# Patient Record
Sex: Male | Born: 1980 | Race: White | Hispanic: No | Marital: Single | Smoking: Current every day smoker
Health system: Southern US, Community
[De-identification: ages and names within clinical notes are randomized; demographics above are authoritative.]

## PROBLEM LIST (undated history)

## (undated) DIAGNOSIS — F1320 Sedative, hypnotic or anxiolytic dependence, uncomplicated: Secondary | ICD-10-CM

## (undated) DIAGNOSIS — F419 Anxiety disorder, unspecified: Secondary | ICD-10-CM

## (undated) DIAGNOSIS — F1010 Alcohol abuse, uncomplicated: Secondary | ICD-10-CM

## (undated) HISTORY — PX: ROTATOR CUFF REPAIR: SHX139

---

## 1999-08-27 ENCOUNTER — Encounter: Payer: Self-pay | Admitting: Orthopedic Surgery

## 1999-08-27 ENCOUNTER — Observation Stay (HOSPITAL_COMMUNITY): Admission: RE | Admit: 1999-08-27 | Discharge: 1999-08-28 | Payer: Self-pay | Admitting: Orthopedic Surgery

## 2002-08-18 ENCOUNTER — Ambulatory Visit (HOSPITAL_COMMUNITY): Admission: EM | Admit: 2002-08-18 | Discharge: 2002-08-18 | Payer: Self-pay | Admitting: Emergency Medicine

## 2003-10-14 ENCOUNTER — Emergency Department (HOSPITAL_COMMUNITY): Admission: EM | Admit: 2003-10-14 | Discharge: 2003-10-15 | Payer: Self-pay | Admitting: Emergency Medicine

## 2005-02-01 ENCOUNTER — Emergency Department (HOSPITAL_COMMUNITY): Admission: EM | Admit: 2005-02-01 | Discharge: 2005-02-01 | Payer: Self-pay | Admitting: Emergency Medicine

## 2010-08-02 ENCOUNTER — Encounter: Payer: Self-pay | Admitting: Emergency Medicine

## 2012-05-02 ENCOUNTER — Encounter (HOSPITAL_COMMUNITY): Payer: Self-pay | Admitting: Emergency Medicine

## 2012-05-02 ENCOUNTER — Emergency Department (HOSPITAL_COMMUNITY)
Admission: EM | Admit: 2012-05-02 | Discharge: 2012-05-02 | Disposition: A | Discharge status: Home / Self Care | Payer: Self-pay | Attending: Emergency Medicine | Admitting: Emergency Medicine

## 2012-05-02 ENCOUNTER — Emergency Department (HOSPITAL_COMMUNITY): Payer: Self-pay

## 2012-05-02 DIAGNOSIS — R350 Frequency of micturition: Secondary | ICD-10-CM | POA: Insufficient documentation

## 2012-05-02 DIAGNOSIS — M549 Dorsalgia, unspecified: Secondary | ICD-10-CM | POA: Insufficient documentation

## 2012-05-02 LAB — URINALYSIS, ROUTINE W REFLEX MICROSCOPIC
Bilirubin Urine: NEGATIVE
Glucose, UA: NEGATIVE mg/dL
Hgb urine dipstick: NEGATIVE
Ketones, ur: NEGATIVE mg/dL
Leukocytes, UA: NEGATIVE
Nitrite: NEGATIVE
Protein, ur: NEGATIVE mg/dL
Specific Gravity, Urine: 1.023 (ref 1.005–1.030)
Urobilinogen, UA: 0.2 mg/dL (ref 0.0–1.0)
pH: 6 (ref 5.0–8.0)

## 2012-05-02 LAB — COMPREHENSIVE METABOLIC PANEL
AST: 18 U/L (ref 0–37)
BUN: 19 mg/dL (ref 6–23)
CO2: 27 mEq/L (ref 19–32)
Chloride: 101 mEq/L (ref 96–112)
Creatinine, Ser: 1.19 mg/dL (ref 0.50–1.35)
GFR calc Af Amer: >90 mL/min (ref >90)
GFR calc non Af Amer: 79 mL/min — ABNORMAL LOW (ref >90)
Glucose, Bld: 95 mg/dL (ref 70–99)
Total Bilirubin: 0.3 mg/dL (ref 0.3–1.2)

## 2012-05-02 LAB — CBC WITH DIFFERENTIAL/PLATELET
Eosinophils Relative: 4 % (ref 0–5)
HCT: 43.2 % (ref 39.0–52.0)
Hemoglobin: 15 g/dL (ref 13.0–17.0)
Lymphocytes Relative: 43 % (ref 12–46)
Lymphs Abs: 2.9 10*3/uL (ref 0.7–4.0)
MCV: 86.9 fL (ref 78.0–100.0)
Monocytes Absolute: 0.5 10*3/uL (ref 0.1–1.0)
Monocytes Relative: 8 % (ref 3–12)
Neutro Abs: 3.1 10*3/uL (ref 1.7–7.7)
WBC: 6.9 10*3/uL (ref 4.0–10.5)

## 2012-05-02 NOTE — ED Notes (Signed)
Pt transported to xray 

## 2012-05-02 NOTE — ED Notes (Signed)
Pt complains of frequent urination since Saturday, no blood noted, pain went away for Sunday and Monday however started to complain of back pain this am with frequent urination again.

## 2012-05-02 NOTE — ED Notes (Signed)
Pt states he is having frequent urination and low back pain   Pt states sxs started Saturday night  Pt states two mths ago he woke up one night and used the restroom and had a burning sensation  Pt states he  drank cranberry juice for a couple of days and it went away

## 2012-05-02 NOTE — ED Provider Notes (Signed)
History     CSN: [id number]  Arrival date & time 05/02/12  1478   First MD Initiated Contact with Patient 05/02/12 0404      Chief Complaint  Patient presents with  . Urinary Frequency    (Consider location/radiation/quality/duration/timing/severity/associated sxs/prior treatment) HPI Comments: 2 days ago, patient developed urinary frequency, and a feeling of fullness, mostly at night.  He reports, that 8 days, ago.  He's told her he stopped drinking 12-24 beers a day, but he been doing for years.  Denies any abdominal trauma, penile discharge with the sexual behavior.  Patient is a 31 y.o. male presenting with frequency. The history is provided by the patient.  Urinary Frequency This is a new problem. The current episode started in the past 7 days. The problem occurs intermittently. The problem has been unchanged. Pertinent negatives include no abdominal pain, anorexia, fever, nausea, vomiting or weakness.    History reviewed. No pertinent past medical history.  Past Surgical History  Procedure Date  . Rotator cuff repair     History reviewed. No pertinent family history.  History  Substance Use Topics  . Smoking status: Never Smoker   . Smokeless tobacco: Not on file  . Alcohol Use: Yes     socially      Review of Systems  Constitutional: Negative for fever.  Gastrointestinal: Negative for nausea, vomiting, abdominal pain and anorexia.  Genitourinary: Positive for frequency. Negative for dysuria, urgency, decreased urine volume, discharge, penile swelling, scrotal swelling and testicular pain.  Musculoskeletal: Positive for back pain.  Neurological: Negative for weakness.    Allergies  Review of patient's allergies indicates no known allergies.  Home Medications   Current Outpatient Rx  Name Route Sig Dispense Refill  . CLONAZEPAM 0.5 MG PO TABS Oral Take 0.5 mg by mouth at bedtime as needed. Sleep      BP 144/102  Pulse 75  Temp 98.4 F (36.9 C)  (Oral)  Resp 20  SpO2 97%  Physical Exam  Constitutional: He is oriented to person, place, and time. He appears well-developed and well-nourished.  HENT:  Head: Normocephalic.  Eyes: Pupils are equal, round, and reactive to light.  Neck: Normal range of motion.  Cardiovascular: Normal rate.   Pulmonary/Chest: Effort normal.  Abdominal: Soft. He exhibits no distension. There is no tenderness.  Musculoskeletal: Normal range of motion.  Neurological: He is alert and oriented to person, place, and time.  Skin: Skin is warm. No rash noted. No erythema.    ED Course  Procedures (including critical care time)  Labs Reviewed  COMPREHENSIVE METABOLIC PANEL - Abnormal; Notable for the following:    GFR calc non Af Amer 79 (*)     All other components within normal limits  URINALYSIS, ROUTINE W REFLEX MICROSCOPIC  CBC WITH DIFFERENTIAL   Dg Abd Acute W/chest  05/02/2012  *RADIOLOGY REPORT*  Clinical Data: Urinary frequency, low back pain.  ACUTE ABDOMEN SERIES (ABDOMEN 2 VIEW & CHEST 1 VIEW)  Comparison: None.  Findings: Lungs are clear.  Cardiomediastinal contours are within normal limits.  No free intraperitoneal air. The bowel gas pattern is non- obstructive. Organ outlines are normal where seen. No acute or aggressive osseous abnormality identified. There is a curvilinear calcific density projecting over the right upper quadrant, nonspecific, however not favored to be renal in origin.  Radiograph has limited sensitivity for renal or ureteral stone detection. CT or ultrasound should be considered if this is of concern.  IMPRESSION: Nonobstructive bowel gas pattern.  Original Report Authenticated By: Waneta Martins, M.D.      1. Urinary frequency       MDM  Urine, CBC, and electrolytes, reviewed all within normal limits.  Acute abdomen obtained.  No evidence of constipation, or kidney stone.  No explanation for patient's urinary frequency.  Obtained during this visit we'll refer  to urology        Arman Filter, NP 05/02/12 0600  Arman Filter, NP 05/02/12 0600

## 2012-05-02 NOTE — ED Provider Notes (Signed)
Medical screening examination/treatment/procedure(s) were performed by non-physician practitioner and as supervising physician I was immediately available for consultation/collaboration.  Cheri Guppy, MD 05/02/12 [phone]

## 2012-07-31 ENCOUNTER — Emergency Department (HOSPITAL_COMMUNITY)
Admission: EM | Admit: 2012-07-31 | Discharge: 2012-07-31 | Disposition: A | Discharge status: Home / Self Care | Payer: Self-pay | Attending: Emergency Medicine | Admitting: Emergency Medicine

## 2012-07-31 ENCOUNTER — Encounter (HOSPITAL_COMMUNITY): Payer: Self-pay | Admitting: *Deleted

## 2012-07-31 DIAGNOSIS — A510 Primary genital syphilis: Secondary | ICD-10-CM | POA: Insufficient documentation

## 2012-07-31 LAB — RPR: RPR Ser Ql: NONREACTIVE

## 2012-07-31 MED ORDER — PENICILLIN G BENZATHINE 1200000 UNIT/2ML IM SUSP
2.4000 10*6.[IU] | Freq: Once | INTRAMUSCULAR | Status: AC
  Administered 2012-07-31: 2.4 10*6.[IU] via INTRAMUSCULAR
  Filled 2012-07-31 (×2): qty 2

## 2012-07-31 NOTE — ED Notes (Signed)
Pt c/o open, weeping sores of foreskin of penis found on Friday morning. Pt states he experienced some flu-like sxs later in the day. Pt c/o dysuria which has subsided. Pt denies new sexual partners.

## 2012-07-31 NOTE — ED Provider Notes (Signed)
History     CSN: [id number]  Arrival date & time 07/31/12  0259   First MD Initiated Contact with Patient 07/31/12 [phone]      Chief Complaint  Patient presents with  . SEXUALLY TRANSMITTED DISEASE    (Consider location/radiation/quality/duration/timing/severity/associated sxs/prior treatment) HPI Comments: Noticed painless lesion on glans several days ago 1 oozed a little the other did not.   Denies pain , itch dysuria, penile discharge Sexually active   The history is provided by the patient.    History reviewed. No pertinent past medical history.  Past Surgical History  Procedure Date  . Rotator cuff repair     History reviewed. No pertinent family history.  History  Substance Use Topics  . Smoking status: Never Smoker   . Smokeless tobacco: Not on file  . Alcohol Use: Yes     Comment: socially      Review of Systems  Constitutional: Negative for fever and chills.  HENT: Negative.   Respiratory: Negative.   Cardiovascular: Negative.   Gastrointestinal: Negative.   Genitourinary: Negative for dysuria, discharge, penile swelling, scrotal swelling and penile pain.  Skin: Positive for wound.  Neurological: Negative.     Allergies  Review of patient's allergies indicates no known allergies.  Home Medications   Current Outpatient Rx  Name  Route  Sig  Dispense  Refill  . CLONAZEPAM 0.5 MG PO TABS   Oral   Take 0.5 mg by mouth at bedtime as needed. Sleep           BP 134/89  Pulse 69  Temp 98.5 F (36.9 C) (Oral)  Resp 16  SpO2 99%  Physical Exam  Constitutional: He appears well-developed and well-nourished.  HENT:  Head: Normocephalic.  Eyes: Pupils are equal, round, and reactive to light.  Neck: Normal range of motion.  Cardiovascular: Normal rate.   Pulmonary/Chest: Effort normal.  Abdominal: Soft.  Genitourinary:    No phimosis, paraphimosis, hypospadias, penile erythema or penile tenderness. No discharge found.    ED Course    Procedures (including critical care time)   Labs Reviewed  RPR   No results found.   No diagnosis found.    MDM  Will obtain RPR suspect syphillis Treated with 2.4 million units Bicillin and referred [facility] for follow up care and further testing        Arman Filter, NP 07/31/12 [phone]

## 2012-08-01 NOTE — ED Provider Notes (Signed)
Medical screening examination/treatment/procedure(s) were performed by non-physician practitioner and as supervising physician I was immediately available for consultation/collaboration.  Farhana Fellows T Ortencia Askari, MD 08/01/12 0736 

## 2013-08-02 ENCOUNTER — Inpatient Hospital Stay (HOSPITAL_COMMUNITY)
Admission: AD | Admit: 2013-08-02 | Discharge: 2013-08-06 | DRG: 897 | Disposition: A | Discharge status: Home / Self Care | Payer: No Typology Code available for payment source | Source: Intra-hospital | Attending: Psychiatry | Admitting: Psychiatry

## 2013-08-02 ENCOUNTER — Encounter (HOSPITAL_COMMUNITY): Payer: Self-pay

## 2013-08-02 ENCOUNTER — Encounter (HOSPITAL_COMMUNITY): Payer: Self-pay | Admitting: Emergency Medicine

## 2013-08-02 ENCOUNTER — Emergency Department (HOSPITAL_COMMUNITY)
Admission: EM | Admit: 2013-08-02 | Discharge: 2013-08-02 | Disposition: A | Discharge status: Home / Self Care | Payer: No Typology Code available for payment source | Attending: Emergency Medicine | Admitting: Emergency Medicine

## 2013-08-02 DIAGNOSIS — F1320 Sedative, hypnotic or anxiolytic dependence, uncomplicated: Secondary | ICD-10-CM | POA: Diagnosis present

## 2013-08-02 DIAGNOSIS — R109 Unspecified abdominal pain: Secondary | ICD-10-CM | POA: Insufficient documentation

## 2013-08-02 DIAGNOSIS — R3989 Other symptoms and signs involving the genitourinary system: Secondary | ICD-10-CM | POA: Insufficient documentation

## 2013-08-02 DIAGNOSIS — F1020 Alcohol dependence, uncomplicated: Secondary | ICD-10-CM | POA: Diagnosis not present

## 2013-08-02 DIAGNOSIS — F1010 Alcohol abuse, uncomplicated: Secondary | ICD-10-CM | POA: Diagnosis present

## 2013-08-02 DIAGNOSIS — F411 Generalized anxiety disorder: Secondary | ICD-10-CM | POA: Diagnosis present

## 2013-08-02 DIAGNOSIS — F17200 Nicotine dependence, unspecified, uncomplicated: Secondary | ICD-10-CM | POA: Diagnosis present

## 2013-08-02 HISTORY — DX: Anxiety disorder, unspecified: F41.9

## 2013-08-02 LAB — RAPID URINE DRUG SCREEN, HOSP PERFORMED
AMPHETAMINES: NOT DETECTED
BARBITURATES: NOT DETECTED
Benzodiazepines: POSITIVE — AB
Cocaine: NOT DETECTED
OPIATES: NOT DETECTED
TETRAHYDROCANNABINOL: NOT DETECTED

## 2013-08-02 LAB — COMPREHENSIVE METABOLIC PANEL
ALT: 201 U/L — AB (ref 0–53)
AST: 120 U/L — AB (ref 0–37)
Albumin: 3.9 g/dL (ref 3.5–5.2)
Alkaline Phosphatase: 78 U/L (ref 39–117)
BILIRUBIN TOTAL: 0.2 mg/dL — AB (ref 0.3–1.2)
BUN: 8 mg/dL (ref 6–23)
CALCIUM: 8.7 mg/dL (ref 8.4–10.5)
CHLORIDE: 102 meq/L (ref 96–112)
CO2: 27 meq/L (ref 19–32)
CREATININE: 0.78 mg/dL (ref 0.50–1.35)
GLUCOSE: 86 mg/dL (ref 70–99)
Potassium: 4 mEq/L (ref 3.7–5.3)
SODIUM: 142 meq/L (ref 137–147)
Total Protein: 7.4 g/dL (ref 6.0–8.3)

## 2013-08-02 LAB — URINALYSIS, ROUTINE W REFLEX MICROSCOPIC
Bilirubin Urine: NEGATIVE
Glucose, UA: NEGATIVE mg/dL
HGB URINE DIPSTICK: NEGATIVE
Ketones, ur: NEGATIVE mg/dL
LEUKOCYTES UA: NEGATIVE
NITRITE: NEGATIVE
PROTEIN: NEGATIVE mg/dL
SPECIFIC GRAVITY, URINE: 1.009 (ref 1.005–1.030)
UROBILINOGEN UA: 0.2 mg/dL (ref 0.0–1.0)
pH: 7 (ref 5.0–8.0)

## 2013-08-02 LAB — CBC
HCT: 43.8 % (ref 39.0–52.0)
HEMOGLOBIN: 14.8 g/dL (ref 13.0–17.0)
MCH: 31.4 pg (ref 26.0–34.0)
MCHC: 33.8 g/dL (ref 30.0–36.0)
MCV: 92.8 fL (ref 78.0–100.0)
Platelets: 207 10*3/uL (ref 150–400)
RBC: 4.72 MIL/uL (ref 4.22–5.81)
RDW: 13.4 % (ref 11.5–15.5)
WBC: 5.4 10*3/uL (ref 4.0–10.5)

## 2013-08-02 LAB — ACETAMINOPHEN LEVEL: Acetaminophen (Tylenol), Serum: <15 ug/mL (ref 10–30)

## 2013-08-02 LAB — ETHANOL: Alcohol, Ethyl (B): 85 mg/dL — ABNORMAL HIGH (ref 0–11)

## 2013-08-02 LAB — SALICYLATE LEVEL: Salicylate Lvl: <2 mg/dL — ABNORMAL LOW (ref 2.8–20.0)

## 2013-08-02 MED ORDER — VITAMIN B-1 100 MG PO TABS
100.0000 mg | ORAL_TABLET | Freq: Every day | ORAL | Status: DC
  Administered 2013-08-02: 100 mg via ORAL
  Filled 2013-08-02: qty 1

## 2013-08-02 MED ORDER — NICOTINE 21 MG/24HR TD PT24
21.0000 mg | MEDICATED_PATCH | Freq: Every day | TRANSDERMAL | Status: DC
  Administered 2013-08-02: 21 mg via TRANSDERMAL
  Filled 2013-08-02: qty 1

## 2013-08-02 MED ORDER — TRAZODONE HCL 50 MG PO TABS
50.0000 mg | ORAL_TABLET | Freq: Every evening | ORAL | Status: DC | PRN
Start: 2013-08-02 — End: 2013-08-06
  Administered 2013-08-02 – 2013-08-05 (×4): 50 mg via ORAL
  Filled 2013-08-02 (×3): qty 1
  Filled 2013-08-02: qty 28
  Filled 2013-08-02: qty 1

## 2013-08-02 MED ORDER — LORAZEPAM 1 MG PO TABS
1.0000 mg | ORAL_TABLET | Freq: Four times a day (QID) | ORAL | Status: DC | PRN
  Administered 2013-08-02: 1 mg via ORAL
  Filled 2013-08-02: qty 1

## 2013-08-02 MED ORDER — LORAZEPAM 2 MG/ML IJ SOLN: 1.0000 mg | Freq: Four times a day (QID) | INTRAMUSCULAR | Status: DC | PRN

## 2013-08-02 MED ORDER — LORAZEPAM 1 MG PO TABS
1.0000 mg | ORAL_TABLET | Freq: Three times a day (TID) | ORAL | Status: DC | PRN
  Administered 2013-08-02 (×2): 1 mg via ORAL
  Filled 2013-08-02: qty 1

## 2013-08-02 MED ORDER — LORAZEPAM 1 MG PO TABS: 0.0000 mg | ORAL_TABLET | Freq: Two times a day (BID) | ORAL | Status: DC

## 2013-08-02 MED ORDER — FOLIC ACID 1 MG PO TABS
1.0000 mg | ORAL_TABLET | Freq: Every day | ORAL | Status: DC
  Administered 2013-08-03 – 2013-08-06 (×4): 1 mg via ORAL
  Filled 2013-08-02 (×5): qty 1

## 2013-08-02 MED ORDER — ADULT MULTIVITAMIN W/MINERALS CH
1.0000 | ORAL_TABLET | Freq: Every day | ORAL | Status: DC
  Administered 2013-08-02: 1 via ORAL
  Filled 2013-08-02: qty 1

## 2013-08-02 MED ORDER — VITAMIN B-1 100 MG PO TABS
100.0000 mg | ORAL_TABLET | Freq: Every day | ORAL | Status: DC
  Administered 2013-08-03: 100 mg via ORAL
  Filled 2013-08-02 (×3): qty 1

## 2013-08-02 MED ORDER — MAGNESIUM HYDROXIDE 400 MG/5ML PO SUSP: 30.0000 mL | Freq: Every day | ORAL | Status: DC | PRN

## 2013-08-02 MED ORDER — ACETAMINOPHEN 325 MG PO TABS: 650.0000 mg | ORAL_TABLET | ORAL | Status: DC | PRN

## 2013-08-02 MED ORDER — THIAMINE HCL 100 MG/ML IJ SOLN: 100.0000 mg | Freq: Every day | INTRAMUSCULAR | Status: DC

## 2013-08-02 MED ORDER — ALUM & MAG HYDROXIDE-SIMETH 200-200-20 MG/5ML PO SUSP: 30.0000 mL | ORAL | Status: DC | PRN

## 2013-08-02 MED ORDER — ONDANSETRON HCL 4 MG PO TABS: 4.0000 mg | ORAL_TABLET | Freq: Three times a day (TID) | ORAL | Status: DC | PRN

## 2013-08-02 MED ORDER — LORAZEPAM 1 MG PO TABS
0.0000 mg | ORAL_TABLET | Freq: Four times a day (QID) | ORAL | Status: DC
  Filled 2013-08-02: qty 1

## 2013-08-02 MED ORDER — FOLIC ACID 1 MG PO TABS
1.0000 mg | ORAL_TABLET | Freq: Every day | ORAL | Status: DC
  Administered 2013-08-02: 1 mg via ORAL
  Filled 2013-08-02: qty 1

## 2013-08-02 MED ORDER — ADULT MULTIVITAMIN W/MINERALS CH
1.0000 | ORAL_TABLET | Freq: Every day | ORAL | Status: DC
  Administered 2013-08-03 – 2013-08-06 (×4): 1 via ORAL
  Filled 2013-08-02 (×5): qty 1

## 2013-08-02 MED ORDER — ACETAMINOPHEN 325 MG PO TABS
650.0000 mg | ORAL_TABLET | Freq: Four times a day (QID) | ORAL | Status: DC | PRN
  Administered 2013-08-04: 650 mg via ORAL
  Filled 2013-08-02: qty 2

## 2013-08-02 NOTE — ED Notes (Signed)
telepsych ready 

## 2013-08-02 NOTE — ED Notes (Signed)
Patients nurse brittany given a copy of pod c guidelines to review and give copy to pt and family.

## 2013-08-02 NOTE — ED Notes (Signed)
PATIENT REPORTS HE JUST GOT OUT OF JAIL YESTERDAY AFTER A 2 DAY STAY. STATES HIS FRIENDS AND FAMILY WOULD NOT PAY HIS $25.00 BOND. STATES THEY TOLD HIM HE NEEDED TO THINK ABOUT HIS ALCOHOL AND THE AFFECT IT IS HAVING ON HIS LIFE. STATES HE HAS BEEN A "HEAVY" DRINKER FOR THE PAST 10 YEARS. STATES HE DRINKS AN AVERAGE OF 7-8 BEERS NIGHTLY UP TO 12-15. HE DENIES HE GETS THE SHAKES IF HE DOESN'T DRINK. STATES HE DOES NOT HAVE TO DRINK IN THE MORNINGS. STATES HE HAS A JOB AND DOESN'T MISS WORK. HE STATES HE HAS HAD A DUI 3 YEARS AGO. HE WAS IN JAIL THIS WEEK DUE TO AN ALCOHOL RELATED INCIDENT. HE STATES THAT HE CANNOT KEEP DRINKING LIKE THIS. STATES HE HAS TO QUIT.

## 2013-08-02 NOTE — ED Notes (Signed)
[facility] NOTIFIED FOR PT'S TRANSFER [facility] . SECURITY WILL NOT TRANSPORT PT. PERSONAL BELONGINGS WILL BE GIVEN TO [facility] REPRESENTATIVE AT TIME OF TRANSPORT.

## 2013-08-02 NOTE — ED Provider Notes (Signed)
CSN: [id number]     Arrival date & time 08/02/13  1118 History   First MD Initiated Contact with Patient 08/02/13 1143     Chief Complaint  Patient presents with  . Medical Clearance   (Consider location/radiation/quality/duration/timing/severity/associated sxs/prior Treatment) Patient is a 33 y.o. male presenting with alcohol problem. The history is provided by the patient. No language interpreter was used.  Alcohol Problem Pertinent negatives include no chills or fever. Associated symptoms comments: Here for detox from alcohol, last drank last night. He states his consumption is a minimum of a 12-pack daily, or "until I pass out". He denies history of seizure from trying to stop drinking. No N, V, tremors. He denies SI/HI or hallucinations.   He complains of bladder pain developing over the last year, sometimes/intermittently severe and radiating to flank. No hematuria, penile discharge or urinary frequency..    Past Medical History  Diagnosis Date  . Anxiety    Past Surgical History  Procedure Laterality Date  . Rotator cuff repair     History reviewed. No pertinent family history. History  Substance Use Topics  . Smoking status: Current Every Day Smoker    Types: Cigarettes  . Smokeless tobacco: Not on file  . Alcohol Use: Yes     Comment: heavily    Review of Systems  Constitutional: Negative for fever and chills.  Respiratory: Negative.   Cardiovascular: Negative.   Gastrointestinal: Negative.   Genitourinary:       See HPI.  Musculoskeletal: Negative.   Skin: Negative.   Neurological: Negative.     Allergies  Review of patient's allergies indicates no known allergies.  Home Medications   Current Outpatient Rx  Name  Route  Sig  Dispense  Refill  . clonazePAM (KLONOPIN) 0.5 MG tablet   Oral   Take 0.5 mg by mouth at bedtime as needed. Sleep          BP 141/98  Pulse 85  Temp(Src) 98.5 F (36.9 C) (Oral)  Resp 18  Wt 170 lb (77.111 kg)  SpO2  99% Physical Exam  Constitutional: He is oriented to person, place, and time. He appears well-developed and well-nourished.  HENT:  Head: Normocephalic.  Neck: Normal range of motion. Neck supple.  Cardiovascular: Normal rate and regular rhythm.   Pulmonary/Chest: Effort normal and breath sounds normal.  Abdominal: Soft. Bowel sounds are normal. There is no tenderness. There is no rebound and no guarding.  Musculoskeletal: Normal range of motion.  Neurological: He is alert and oriented to person, place, and time.  Skin: Skin is warm and dry. No rash noted.  Psychiatric: He has a normal mood and affect.    ED Course  Procedures (including critical care time) Labs Review Labs Reviewed  ACETAMINOPHEN LEVEL  CBC  COMPREHENSIVE METABOLIC PANEL  ETHANOL  SALICYLATE LEVEL  URINE RAPID DRUG SCREEN (HOSP PERFORMED)  URINALYSIS, ROUTINE W REFLEX MICROSCOPIC   Results for orders placed during the hospital encounter of 08/02/13  ACETAMINOPHEN LEVEL      Result Value Range   Acetaminophen (Tylenol), Serum <15.0  10 - 30 ug/mL  CBC      Result Value Range   WBC 5.4  4.0 - 10.5 K/uL   RBC 4.72  4.22 - 5.81 MIL/uL   Hemoglobin 14.8  13.0 - 17.0 g/dL   HCT 52.843.8  41.339.0 - 24.452.0 %   MCV 92.8  78.0 - 100.0 fL   MCH 31.4  26.0 - 34.0 pg   MCHC 33.8  30.0 - 36.0 g/dL   RDW 16.1  09.6 - 04.5 %   Platelets 207  150 - 400 K/uL  COMPREHENSIVE METABOLIC PANEL      Result Value Range   Sodium 142  137 - 147 mEq/L   Potassium 4.0  3.7 - 5.3 mEq/L   Chloride 102  96 - 112 mEq/L   CO2 27  19 - 32 mEq/L   Glucose, Bld 86  70 - 99 mg/dL   BUN 8  6 - 23 mg/dL   Creatinine, Ser 4.09  0.50 - 1.35 mg/dL   Calcium 8.7  8.4 - 81.1 mg/dL   Total Protein 7.4  6.0 - 8.3 g/dL   Albumin 3.9  3.5 - 5.2 g/dL   AST 914 (*) 0 - 37 U/L   ALT 201 (*) 0 - 53 U/L   Alkaline Phosphatase 78  39 - 117 U/L   Total Bilirubin 0.2 (*) 0.3 - 1.2 mg/dL   GFR calc non Af Amer >90  >90 mL/min   GFR calc Af Amer >90  >90  mL/min  ETHANOL      Result Value Range   Alcohol, Ethyl (B) 85 (*) 0 - 11 mg/dL  SALICYLATE LEVEL      Result Value Range   Salicylate Lvl <2.0 (*) 2.8 - 20.0 mg/dL  URINE RAPID DRUG SCREEN (HOSP PERFORMED)      Result Value Range   Opiates NONE DETECTED  NONE DETECTED   Cocaine NONE DETECTED  NONE DETECTED   Benzodiazepines POSITIVE (*) NONE DETECTED   Amphetamines NONE DETECTED  NONE DETECTED   Tetrahydrocannabinol NONE DETECTED  NONE DETECTED   Barbiturates NONE DETECTED  NONE DETECTED  URINALYSIS, ROUTINE W REFLEX MICROSCOPIC      Result Value Range   Color, Urine YELLOW  YELLOW   APPearance CLEAR  CLEAR   Specific Gravity, Urine 1.009  1.005 - 1.030   pH 7.0  5.0 - 8.0   Glucose, UA NEGATIVE  NEGATIVE mg/dL   Hgb urine dipstick NEGATIVE  NEGATIVE   Bilirubin Urine NEGATIVE  NEGATIVE   Ketones, ur NEGATIVE  NEGATIVE mg/dL   Protein, ur NEGATIVE  NEGATIVE mg/dL   Urobilinogen, UA 0.2  0.0 - 1.0 mg/dL   Nitrite NEGATIVE  NEGATIVE   Leukocytes, UA NEGATIVE  NEGATIVE    Imaging Review No results found.  EKG Interpretation   None       MDM  No diagnosis found. 1. Alcoholism  Patient transferred to Pod C to await evaluation by [facility] for possible alcohol treatment.    Arnoldo Hooker, PA-C 08/03/13 2105

## 2013-08-02 NOTE — BH Assessment (Signed)
Tele Assessment Note   Benjamin Lindsey is an 33 y.o. male. Patient presents to the emergency department requesting detox. Patient drinks 12-15 beers per day and up to 30 beers on "football Sundays".  He has been drinking this heavily for the past 10 years.  He has been taking Klonopin TID for the past 2 years for anxiety.  He was arrested for trespassing on Tuesday night. Patient states he was at a bar and they stopped serving him because he was intoxicated. Patient left in an angry manner and was waiting for a ride outside of the bar. Patient states bartender told him to leave the area but patient refused as he was waiting for a ride. Bartender then called the police and he was arrested for trespassing. He remained in jail for 24 hours because parents refused to bail him out. Patient states this jail experience was an "eye opener" and patient decided he needed to quit drinking.  Patient has not received detox treatment before. One year ago he stopped drinking on his own for 2 months. He denies depression, suicidal, and homicidal ideation. Patient denies hallucinations and delusions.  Patient complains of intermittent stomach pain when drinking. He denies withdrawal symptoms. He smokes one pack of cigarettes per day.  Patient has problems with his girlfriend because of his drinking. He has missed one day of work in his current job of 6 months. Prior to that he worked at the [employer] for 10 years and had a lot of absences due to hangovers. He had one DUI 10 years ago.   Patient agreeable to treatment. Patient accepted by Janann August, NP to bed 301-1 at [facility]   Axis I: Alcohol Abuse Axis II: Deferred Axis III:  Past Medical History  Diagnosis Date  . Anxiety    Axis IV: problems related to legal system/crime and problems with primary support group Axis V: 31-40 impairment in reality testing  Past Medical History:  Past Medical History  Diagnosis Date  . Anxiety     Past Surgical History   Procedure Laterality Date  . Rotator cuff repair      Family History: History reviewed. No pertinent family history.  Social History:  reports that he has been smoking Cigarettes.  He has been smoking about 1.00 pack per day. He does not have any smokeless tobacco history on file. He reports that he drinks alcohol. He reports that he does not use illicit drugs.  Additional Social History:  Alcohol / Drug Use Pain Medications:  (denies) Prescriptions:  (klonopin as directed) Over the Counter:  (denies) History of alcohol / drug use?: Yes Longest period of sobriety (when/how long): 2 months one year ago Negative Consequences of Use: Personal relationships;Legal Withdrawal Symptoms: Other (Comment) (denies withdrawal. Takes Klonopin TID) Substance #1 Name of Substance 1:  (beer) 1 - Age of First Use:  (14) 1 - Amount (size/oz):  (12 - 15 cans of beer per day. 30 beers at times) 1 - Frequency:  (daily) 1 - Duration:  (10 years) 1 - Last Use / Amount:  (0830 today)  CIWA: CIWA-Ar BP: 134/88 mmHg Pulse Rate: 83 Nausea and Vomiting: no nausea and no vomiting Tactile Disturbances: none Tremor: no tremor Auditory Disturbances: not present Paroxysmal Sweats: no sweat visible Visual Disturbances: not present Anxiety: moderately anxious, or guarded, so anxiety is inferred Headache, Fullness in Head: none present Agitation: somewhat more than normal activity Orientation and Clouding of Sensorium: oriented and can do serial additions CIWA-Ar Total: 5  COWS:    Allergies: No Known Allergies  Home Medications:  (Not in a hospital admission)  OB/GYN Status:  No LMP for male patient.  General Assessment Data Location of Assessment: [facility] ED Is this a Tele or Face-to-Face Assessment?: Tele Assessment Is this an Initial Assessment or a Re-assessment for this encounter?: Initial Assessment Living Arrangements: Alone Can pt return to current living arrangement?: Yes Admission Status:  Voluntary Is patient capable of signing voluntary admission?: Yes Transfer from: Home Referral Source: Self/Family/Friend     [facility] Crisis Care Plan Living Arrangements: Alone  Education Status Is patient currently in school?: No Highest grade of school patient has completed: 2 years college  Risk to self Suicidal Ideation: No Suicidal Intent: No Is patient at risk for suicide?: No Suicidal Plan?: No Access to Means: No What has been your use of drugs/alcohol within the last 12 months?: daily beer Previous Attempts/Gestures: No Other Self Harm Risks:  (denies) Intentional Self Injurious Behavior: None Family Suicide History: No Recent stressful life event(s): Other (Comment) (24 hours in jail) Persecutory voices/beliefs?: No Depression: No Substance abuse history and/or treatment for substance abuse?: Yes Suicide prevention information given to non-admitted patients: Not applicable  Risk to Others Homicidal Ideation: No Thoughts of Harm to Others: No Current Homicidal Intent: No Current Homicidal Plan: No Access to Homicidal Means: No History of harm to others?: No Assessment of Violence: None Noted Does patient have access to weapons?: No Criminal Charges Pending?: Yes Describe Pending Criminal Charges: Dub Amis Does patient have a court date: Yes Court Date: 09/06/13  Psychosis Hallucinations: None noted Delusions: None noted  Mental Status Report Appear/Hygiene: Other (Comment) (unremarkable) Eye Contact: Good Motor Activity: Freedom of movement Speech: Logical/coherent Level of Consciousness: Alert Mood: Anxious Affect: Appropriate to circumstance Anxiety Level: Minimal Thought Processes: Coherent;Relevant Judgement: Impaired Orientation: Person;Place;Time;Situation Obsessive Compulsive Thoughts/Behaviors: None  Cognitive Functioning Concentration: Normal Memory: Recent Intact;Remote Intact IQ: Average Insight: Fair Impulse Control:  Poor Appetite: Fair Weight Loss: 0 Weight Gain: 0 Sleep: No Change Vegetative Symptoms: None  ADLScreening [facility] Assessment Services) Patient's cognitive ability adequate to safely complete daily activities?: Yes Patient able to express need for assistance with ADLs?: Yes Independently performs ADLs?: Yes (appropriate for developmental age)  Prior Inpatient Therapy Prior Inpatient Therapy: No  Prior Outpatient Therapy Prior Outpatient Therapy: Yes Prior Therapy Dates: Past 2 years Prior Therapy Facilty/Provider(s): [facility] - Dr. Betti Cruz Reason for Treatment: medication   ADL Screening (condition at time of admission) Patient's cognitive ability adequate to safely complete daily activities?: Yes Is the patient deaf or have difficulty hearing?: No Does the patient have difficulty seeing, even when wearing glasses/contacts?: No Does the patient have difficulty concentrating, remembering, or making decisions?: No Patient able to express need for assistance with ADLs?: Yes Does the patient have difficulty dressing or bathing?: No Independently performs ADLs?: Yes (appropriate for developmental age) Does the patient have difficulty walking or climbing stairs?: No Weakness of Legs: None Weakness of Arms/Hands: None  Home Assistive Devices/Equipment Home Assistive Devices/Equipment: None    Abuse/Neglect Assessment (Assessment to be complete while patient is alone) Physical Abuse: Denies Verbal Abuse: Denies Sexual Abuse: Denies Exploitation of patient/patient's resources: Denies Self-Neglect: Denies Values / Beliefs Cultural Requests During Hospitalization: None Spiritual Requests During Hospitalization: None   Advance Directives (For Healthcare) Advance Directive: Patient does not have advance directive;Patient would not like information Pre-existing out of facility DNR order (yellow form or pink MOST form): No Nutrition Screen- [facility] Adult/[facility]/AP Patient's home diet:  Regular  Additional Information 1:1 In Past 12 Months?: No CIRT Risk: No Elopement Risk: No Does patient have medical clearance?: Yes     Disposition:  Disposition Initial Assessment Completed for this Encounter: Yes Disposition of Patient: Inpatient treatment program Type of inpatient treatment program: Adult  Yates DecampSouthard, Sukaina Toothaker Ann 08/02/2013 8:35 PM

## 2013-08-02 NOTE — ED Notes (Signed)
PATIENT IS INTERESTED IN GOING TO [facility]. HE IS ON THE PHONE DOING A PHONE ASSESSMENT WITH THEM AT THIS TIME.  [phone]

## 2013-08-02 NOTE — Progress Notes (Signed)
Patient disposition recommendations reviewed with Dr. Effie ShyWentz, MD. Rosey BathKelly Alyzabeth Pontillo, RN

## 2013-08-02 NOTE — ED Notes (Signed)
Pt is requesting detox from etoh, last drank last night. Denies any SI or HI. Calm and cooperative at triage.

## 2013-08-03 ENCOUNTER — Encounter (HOSPITAL_COMMUNITY): Payer: Self-pay | Admitting: Psychiatry

## 2013-08-03 DIAGNOSIS — F1020 Alcohol dependence, uncomplicated: Secondary | ICD-10-CM | POA: Diagnosis present

## 2013-08-03 DIAGNOSIS — F411 Generalized anxiety disorder: Secondary | ICD-10-CM | POA: Diagnosis present

## 2013-08-03 DIAGNOSIS — F1320 Sedative, hypnotic or anxiolytic dependence, uncomplicated: Secondary | ICD-10-CM | POA: Diagnosis present

## 2013-08-03 DIAGNOSIS — F1994 Other psychoactive substance use, unspecified with psychoactive substance-induced mood disorder: Secondary | ICD-10-CM

## 2013-08-03 MED ORDER — CHLORDIAZEPOXIDE HCL 25 MG PO CAPS
25.0000 mg | ORAL_CAPSULE | Freq: Four times a day (QID) | ORAL | Status: AC | PRN
  Administered 2013-08-04: 25 mg via ORAL
  Filled 2013-08-03: qty 1

## 2013-08-03 MED ORDER — CHLORDIAZEPOXIDE HCL 25 MG PO CAPS
25.0000 mg | ORAL_CAPSULE | Freq: Every day | ORAL | Status: AC
  Administered 2013-08-06: 25 mg via ORAL
  Filled 2013-08-03: qty 1

## 2013-08-03 MED ORDER — THIAMINE HCL 100 MG/ML IJ SOLN: 100.0000 mg | Freq: Once | INTRAMUSCULAR | Status: AC

## 2013-08-03 MED ORDER — VITAMIN B-1 100 MG PO TABS
100.0000 mg | ORAL_TABLET | Freq: Every day | ORAL | Status: DC
  Administered 2013-08-04 – 2013-08-06 (×3): 100 mg via ORAL
  Filled 2013-08-03 (×4): qty 1

## 2013-08-03 MED ORDER — LOPERAMIDE HCL 2 MG PO CAPS: 2.0000 mg | ORAL_CAPSULE | ORAL | Status: AC | PRN

## 2013-08-03 MED ORDER — INFLUENZA VAC SPLIT QUAD 0.5 ML IM SUSP
0.5000 mL | INTRAMUSCULAR | Status: AC
  Administered 2013-08-04: 0.5 mL via INTRAMUSCULAR
  Filled 2013-08-03: qty 0.5

## 2013-08-03 MED ORDER — CHLORDIAZEPOXIDE HCL 25 MG PO CAPS
25.0000 mg | ORAL_CAPSULE | Freq: Four times a day (QID) | ORAL | Status: AC
  Administered 2013-08-03 (×3): 25 mg via ORAL
  Filled 2013-08-03 (×3): qty 1

## 2013-08-03 MED ORDER — ADULT MULTIVITAMIN W/MINERALS CH
1.0000 | ORAL_TABLET | Freq: Every day | ORAL | Status: DC
  Filled 2013-08-03 (×3): qty 1

## 2013-08-03 MED ORDER — CHLORDIAZEPOXIDE HCL 25 MG PO CAPS
25.0000 mg | ORAL_CAPSULE | Freq: Three times a day (TID) | ORAL | Status: AC
  Administered 2013-08-04 (×3): 25 mg via ORAL
  Filled 2013-08-03 (×3): qty 1

## 2013-08-03 MED ORDER — HYDROXYZINE HCL 25 MG PO TABS
25.0000 mg | ORAL_TABLET | Freq: Four times a day (QID) | ORAL | Status: AC | PRN
Start: 2013-08-03 — End: 2013-08-06
  Administered 2013-08-04 – 2013-08-05 (×2): 25 mg via ORAL
  Filled 2013-08-03 (×3): qty 1

## 2013-08-03 MED ORDER — ONDANSETRON 4 MG PO TBDP: 4.0000 mg | ORAL_TABLET | Freq: Four times a day (QID) | ORAL | Status: AC | PRN

## 2013-08-03 MED ORDER — NICOTINE 21 MG/24HR TD PT24: 21.0000 mg | MEDICATED_PATCH | Freq: Every day | TRANSDERMAL | Status: DC

## 2013-08-03 MED ORDER — CHLORDIAZEPOXIDE HCL 25 MG PO CAPS
25.0000 mg | ORAL_CAPSULE | ORAL | Status: AC
  Administered 2013-08-05 (×2): 25 mg via ORAL
  Filled 2013-08-03 (×2): qty 1

## 2013-08-03 MED ORDER — NICOTINE 21 MG/24HR TD PT24
21.0000 mg | MEDICATED_PATCH | Freq: Every day | TRANSDERMAL | Status: DC
  Administered 2013-08-04 – 2013-08-05 (×2): 21 mg via TRANSDERMAL
  Filled 2013-08-03 (×7): qty 1

## 2013-08-03 NOTE — Progress Notes (Signed)
Pt is a [age] year old male admitted with ETOH dependency and requesting detox and assist in finding appropriate long term treatment for him   He drinks 12 to 15 beers daily and has just recently got out of jail from a situation which occurred because of excessive drinking    He is pleasant and cooperative during the assessment  He reports some mild anxiety and agitation but claims no other withdrawal symptoms   He denies suicidal ideation and homicidal ideation  Pt was oriented to the unit and offered nourishment   Medications were administered and evaluated for effectiveness   Q 15 min checks   Pt safe at present and adjusting well

## 2013-08-03 NOTE — [facility] Suicide Risk Assessment (Signed)
[facility] INPATIENT: Family/Significant Other Suicide Prevention Education  Suicide Prevention Education:  Education Completed; No one has been identified by the patient as the family member/significant other with whom the patient will be residing, and identified as the person(s) who will aid the patient in the event of a mental health crisis (suicidal ideations/suicide attempt).   Pt did not c/o SI at admission, nor have they endorsed SI during their stay here. SPE not required. SPI pamphlet provided to pt and he was encouraged to share information with his support network.   The Sherwin-WilliamsHeather Smart, LCSWA 08/03/2013 9:29 AM

## 2013-08-03 NOTE — [facility] Suicide Risk Assessment (Signed)
Suicide Risk Assessment  Admission Assessment     Nursing information obtained from:    Demographic factors:    Current Mental Status:    Loss Factors:    Historical Factors:    Risk Reduction Factors:     CLINICAL FACTORS:   Alcohol/Substance Abuse/Dependencies  COGNITIVE FEATURES THAT CONTRIBUTE TO RISK:  Closed-mindedness Polarized thinking Thought constriction (tunnel vision)    SUICIDE RISK:   Moderate:  Frequent suicidal ideation with limited intensity, and duration, some specificity in terms of plans, no associated intent, good self-control, limited dysphoria/symptomatology, some risk factors present, and identifiable protective factors, including available and accessible social support.  PLAN OF CARE: Supportive approach/coping skills/relapse prevention                              [facility] detox protocol                              Reassess and address the co morbidities   I certify that inpatient services furnished can reasonably be expected to improve the patient's condition.  Krystn Dermody A 08/03/2013, 3:12 PM

## 2013-08-03 NOTE — Tx Team (Signed)
Initial Interdisciplinary Treatment Plan  PATIENT STRENGTHS: (choose at least two) Average or above average intelligence Capable of independent living General fund of knowledge Supportive family/friends Work skills  PATIENT STRESSORS: Legal issue Medication change or noncompliance Substance abuse   PROBLEM LIST: Problem List/Patient Goals Date to be addressed Date deferred Reason deferred Estimated date of resolution  ETOH Dependence                                                       DISCHARGE CRITERIA:  Ability to meet basic life and health needs Motivation to continue treatment in a less acute level of care Verbal commitment to aftercare and medication compliance Withdrawal symptoms are absent or subacute and managed without 24-hour nursing intervention  PRELIMINARY DISCHARGE PLAN: Attend aftercare/continuing care group Attend 12-step recovery group Return to previous work or school arrangements  PATIENT/FAMIILY INVOLVEMENT: This treatment plan has been presented to and reviewed with the patient, Mayer CamelMichael A Dipiero, and/or family member, .  The patient and family have been given the opportunity to ask questions and make suggestions.  Andrena Mewsuttall, Sarafina Puthoff J 08/03/2013, 12:19 AM

## 2013-08-03 NOTE — [facility] Group Notes (Signed)
[facility]   08/03/2013 9:30 AM  Participation Quality:  Appropriate   Mood/Affect:  Appropriate  Depression Rating:  2  Anxiety Rating:  7  Thoughts of Suicide:  No Will you contract for safety?   NA  Current AVH:  No  Plan for Discharge/Comments:  Pt reports that he [place] and is interested in inpatient facility (28day)-[employer] insurance. [facility].   Transportation Means: family/gf   Supports: girlfriend and family Psychiatric nursemembers/parents   Smart, OncologistHeather LCSWA

## 2013-08-03 NOTE — H&P (Signed)
Psychiatric Admission Assessment Adult  Patient Identification:  Benjamin Lindsey Date of Evaluation:  08/03/2013 Chief Complaint:  ALCOHOL DEPENDENCE BENZO DEPENDENCE History of Present Illness:: 33 Y/ O male who states he was at a bar. He has had issues with the bartender before. He has been drinking was cut from any more alcohol. He step out, was trying to get back to the bar. Police were called. He was charged with braking and entering. Heavy drinker for 10 years. 10 pack to 18 to 30 beers on "Footbal Sundays". Has been drinking most everyday for 10 years. He abstained for 2 months encouraged by his parents "did not leave the house." states he craved it all along romanticized its use. Relapsed.       States in [employer] he was shy, that caused some depression. Started drinking when he was 62 (liked not being afraid anymore) States he used to have some social anxiety. States alcohol help. Got to use cocaine early on, not anymore. Marijuana made the anxiety worst. That night his best friend did not want to pick him up and once taken to jail his parents did not want to put the $25 for bail. This was a wake up call. His sister picked him up. He agreed to get some help. He uses Klonopin and states he has had "horrible withdrawals"  Elements:  Location:  alcohol, banzodiazepine use, underlying anxiety disorder. Quality:  his use affecting his relationships. He was getting married in 6 months finacee changed the date waiting for him to get his alcoholism under control. Severity:  building up, high tolerance, when combined wiht his benzodiazepine has gotten more severe. Timing:  drinking every day baseline 10 beers. Duration:  last 10 years. Context:  started as self medicating his anxiety, shyness, build up to being dependent. Associated Signs/Synptoms: Depression Symptoms:  Denies (Hypo) Manic Symptoms:  Denies Anxiety Symptoms:  Excessive Worry, Social Anxiety, Psychotic Symptoms:  Denies PTSD  Symptoms: Denies   Psychiatric Specialty Exam: Physical Exam  Review of Systems  Constitutional: Negative.   HENT: Negative.   Eyes: Negative.   Respiratory: Positive for cough.        Pack a day  Cardiovascular: Positive for palpitations.  Gastrointestinal: Negative.   Genitourinary: Positive for flank pain.  Musculoskeletal: Negative.   Skin: Negative.   Neurological: Negative.   Endo/Heme/Allergies: Negative.   Psychiatric/Behavioral: Positive for substance abuse. The patient is nervous/anxious and has insomnia.     Blood pressure 152/68, pulse 94, temperature 97.5 F (36.4 C), temperature source Oral, resp. rate 18, height $RemoveBe'5\' 10"'zsJmVubYE$  (1.778 m), weight 77.111 kg (170 lb).Body mass index is 24.39 kg/(m^2).  General Appearance: Fairly Groomed  Engineer, water::  Fair  Speech:  Clear and Coherent  Volume:  Normal  Mood:  Anxious and worried  Affect:  anxious, worried  Thought Process:  Coherent and Goal Directed  Orientation:  Full (Time, Place, and Person)  Thought Content:  symptoms, worries, concerns  Suicidal Thoughts:  No  Homicidal Thoughts:  No  Memory:  Immediate;   Fair Recent;   Fair Remote;   Fair  Judgement:  Fair  Insight:  Present and Shallow  Psychomotor Activity:  Restlessness  Concentration:  Fair  Recall:  Fair  Akathisia:  No  Handed:    AIMS (if indicated):     Assets:  Desire for Improvement Housing Social Support Transportation Vocational/Educational  Sleep:  Number of Hours: 5.5    Past Psychiatric History: Diagnosis:  Hospitalizations: Denies  Outpatient Care: Saw Dr. Betti Cruz who gave him   Substance Abuse Care: Denies  Self-Mutilation: Denies  Suicidal Attempts: Denies  Violent Behaviors: Denies   Past Medical History:   Past Medical History  Diagnosis Date  . Anxiety    Traumatic Brain Injury:  Sports Related Allergies:  No Known Allergies PTA Medications: Prescriptions prior to admission  Medication Sig Dispense Refill  .  clonazePAM (KLONOPIN) 0.5 MG tablet Take 0.5 mg by mouth 4 (four) times daily as needed for anxiety. Sleep        Previous Psychotropic Medications:  Medication/Dose  Propanol, clonazepam PRN               Substance Abuse History in the last 12 months:  yes  Consequences of Substance Abuse: Legal Consequences:  3 DWI Blackouts:    Social History:  reports that he has been smoking Cigarettes.  He has been smoking about 1.00 pack per day. He does not have any smokeless tobacco history on file. He reports that he drinks alcohol. He reports that he does not use illicit drugs. Additional Social History: History of alcohol / drug use?: Yes Longest period of sobriety (when/how long): 2 months one year ago Negative Consequences of Use: Financial;Legal;Personal relationships Withdrawal Symptoms: Other (Comment) Name of Substance 1: ETOH 1 - Age of First Use: 14 1 - Amount (size/oz): 12 to 15 beers daily 1 - Frequency: daily 1 - Duration: 10 years 1 - Last Use / Amount: today 08/02/13                  Current Place of Residence:  Lives with girlfriend  Place of Birth:   Family Members: Marital Status:  Single Children:  Sons:  Daughters:13 Relationships: Education:  [employer] for two years 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 Legal History: Reckless driving, Braking and entering Hobbies/Interests:  Family History:  No family history on file. Both parents used to  drink, mother was addicted to cocaine Results for orders placed during the hospital encounter of 08/02/13 (from the past 72 hour(s))  ACETAMINOPHEN LEVEL     Status: None   Collection Time    08/02/13 11:32 AM      Result Value Range   Acetaminophen (Tylenol),  Serum <15.0  10 - 30 ug/mL   Comment:            THERAPEUTIC CONCENTRATIONS VARY     SIGNIFICANTLY. A RANGE OF 10-30     ug/mL MAY BE AN EFFECTIVE     CONCENTRATION FOR MANY PATIENTS.     HOWEVER, SOME ARE BEST TREATED     AT CONCENTRATIONS OUTSIDE THIS     RANGE.     ACETAMINOPHEN CONCENTRATIONS     >150 ug/mL AT 4 HOURS AFTER     INGESTION AND >50 ug/mL AT 12     HOURS AFTER INGESTION ARE     OFTEN ASSOCIATED WITH TOXIC     REACTIONS.  CBC     Status: None   Collection Time    08/02/13 11:32 AM      Result Value Range   WBC 5.4  4.0 - 10.5 K/uL   RBC 4.72  4.22 - 5.81 MIL/uL   Hemoglobin 14.8  13.0 - 17.0 g/dL   HCT 19.7  37.6 - 22.3 %   MCV  92.8  78.0 - 100.0 fL   MCH 31.4  26.0 - 34.0 pg   MCHC 33.8  30.0 - 36.0 g/dL   RDW 13.4  11.5 - 15.5 %   Platelets 207  150 - 400 K/uL  COMPREHENSIVE METABOLIC PANEL     Status: Abnormal   Collection Time    08/02/13 11:32 AM      Result Value Range   Sodium 142  137 - 147 mEq/L   Potassium 4.0  3.7 - 5.3 mEq/L   Chloride 102  96 - 112 mEq/L   CO2 27  19 - 32 mEq/L   Glucose, Bld 86  70 - 99 mg/dL   BUN 8  6 - 23 mg/dL   Creatinine, Ser 0.78  0.50 - 1.35 mg/dL   Calcium 8.7  8.4 - 10.5 mg/dL   Total Protein 7.4  6.0 - 8.3 g/dL   Albumin 3.9  3.5 - 5.2 g/dL   AST 120 (*) 0 - 37 U/L   ALT 201 (*) 0 - 53 U/L   Alkaline Phosphatase 78  39 - 117 U/L   Total Bilirubin 0.2 (*) 0.3 - 1.2 mg/dL   GFR calc non Af Amer >90  >90 mL/min   GFR calc Af Amer >90  >90 mL/min   Comment: (NOTE)     The eGFR has been calculated using the CKD EPI equation.     This calculation has not been validated in all clinical situations.     eGFR's persistently <90 mL/min signify possible Chronic Kidney     Disease.  ETHANOL     Status: Abnormal   Collection Time    08/02/13 11:32 AM      Result Value Range   Alcohol, Ethyl (B) 85 (*) 0 - 11 mg/dL   Comment:            LOWEST DETECTABLE LIMIT FOR     SERUM ALCOHOL IS 11 mg/dL     FOR MEDICAL  PURPOSES ONLY  SALICYLATE LEVEL     Status: Abnormal   Collection Time    08/02/13 11:32 AM      Result Value Range   Salicylate Lvl <1.9 (*) 2.8 - 20.0 mg/dL  URINE RAPID DRUG SCREEN (HOSP PERFORMED)     Status: Abnormal   Collection Time    08/02/13 11:44 AM      Result Value Range   Opiates NONE DETECTED  NONE DETECTED   Cocaine NONE DETECTED  NONE DETECTED   Benzodiazepines POSITIVE (*) NONE DETECTED   Amphetamines NONE DETECTED  NONE DETECTED   Tetrahydrocannabinol NONE DETECTED  NONE DETECTED   Barbiturates NONE DETECTED  NONE DETECTED   Comment:            DRUG SCREEN FOR MEDICAL PURPOSES     ONLY.  IF CONFIRMATION IS NEEDED     FOR ANY PURPOSE, NOTIFY LAB     WITHIN 5 DAYS.                LOWEST DETECTABLE LIMITS     FOR URINE DRUG SCREEN     Drug Class       Cutoff (ng/mL)     Amphetamine      1000     Barbiturate      200     Benzodiazepine   417     Tricyclics       408     Opiates  300     Cocaine          300     THC              50  URINALYSIS, ROUTINE W REFLEX MICROSCOPIC     Status: None   Collection Time    08/02/13 11:44 AM      Result Value Range   Color, Urine YELLOW  YELLOW   APPearance CLEAR  CLEAR   Specific Gravity, Urine 1.009  1.005 - 1.030   pH 7.0  5.0 - 8.0   Glucose, UA NEGATIVE  NEGATIVE mg/dL   Hgb urine dipstick NEGATIVE  NEGATIVE   Bilirubin Urine NEGATIVE  NEGATIVE   Ketones, ur NEGATIVE  NEGATIVE mg/dL   Protein, ur NEGATIVE  NEGATIVE mg/dL   Urobilinogen, UA 0.2  0.0 - 1.0 mg/dL   Nitrite NEGATIVE  NEGATIVE   Leukocytes, UA NEGATIVE  NEGATIVE   Comment: MICROSCOPIC NOT DONE ON URINES WITH NEGATIVE PROTEIN, BLOOD, LEUKOCYTES, NITRITE, OR GLUCOSE <1000 mg/dL.   Psychological Evaluations:  Assessment:   DSM5:  Schizophrenia Disorders:  none Obsessive-Compulsive Disorders:  none Trauma-Stressor Disorders:  none Substance/Addictive Disorders:  Alcohol Related Disorder - Severe (303.90), Benzodiazepine related  disorder Depressive Disorders:  none  AXIS I:  Anxiety Disorder NOS, Social Anxiety and Substance Induced Mood Disorder AXIS II:  Deferred AXIS III:   Past Medical History  Diagnosis Date  . Anxiety    AXIS IV:  other psychosocial or environmental problems AXIS V:  41-50 serious symptoms  Treatment Plan/Recommendations:  Supportive approach/coping skills/relapse prevention                                                                 [facility] detox protocol                                                                 Reassess and address the co morbidities                                                                 Discuss the Lab findings ( increased liver enzymes) Treatment Plan Summary: Daily contact with patient to assess and evaluate symptoms and progress in treatment Medication management Current Medications:  Current Facility-Administered Medications  Medication Dose Route Frequency Provider Last Rate Last Dose  . acetaminophen (TYLENOL) tablet 650 mg  650 mg Oral Q6H PRN Evanna Glenda Chroman, NP      . alum & mag hydroxide-simeth (MAALOX/MYLANTA) 200-200-20 MG/5ML suspension 30 mL  30 mL Oral Q4H PRN Evanna Glenda Chroman, NP      . folic acid (FOLVITE) tablet 1 mg  1 mg Oral Daily Evanna Glenda Chroman, NP   1 mg at 08/03/13 0816  . [START ON 08/04/2013] influenza vac split quadrivalent PF (FLUARIX) injection 0.5 mL  0.5 mL Intramuscular Tomorrow-1000 Nicholaus Bloom, MD      . LORazepam (ATIVAN) tablet 1 mg  1 mg Oral Q6H PRN Malena Peer, NP   1 mg at 08/02/13 2335   Or  . LORazepam (ATIVAN) injection 1 mg  1 mg Intravenous Q6H PRN Evanna Glenda Chroman, NP      . magnesium hydroxide (MILK OF MAGNESIA) suspension 30 mL  30 mL Oral Daily PRN Evanna Glenda Chroman, NP      . multivitamin with minerals tablet 1 tablet  1 tablet Oral Daily Evanna Glenda Chroman, NP   1 tablet at 08/03/13 0816  . nicotine (NICODERM CQ - dosed in mg/24 hours) patch 21 mg  21 mg Transdermal Daily  Nicholaus Bloom, MD      . thiamine (VITAMIN B-1) tablet 100 mg  100 mg Oral Daily Evanna Cori Greig Castilla, NP   100 mg at 08/03/13 4103   Or  . thiamine (B-1) injection 100 mg  100 mg Intravenous Daily Evanna Glenda Chroman, NP      . traZODone (DESYREL) tablet 50 mg  50 mg Oral QHS PRN,MR X 1 Evanna Cori Greig Castilla, NP   50 mg at 08/02/13 2335    Observation Level/Precautions:  15 minute checks  Laboratory:  As per the ED  Psychotherapy:  Individual/group  Medications:  [facility] detox protocol  Consultations:    Discharge Concerns:    Estimated LOS: 3-5 days  Other:     I certify that inpatient services furnished can reasonably be expected to improve the patient's condition.   Juel Ripley A 1/23/20158:49 AM

## 2013-08-03 NOTE — Progress Notes (Signed)
[facility] Group Notes:  (Nursing/MHT/Case Management/Adjunct)  Date:  08/03/2013  Time:  9:47 PM  Type of Therapy:  Group Therapy  Participation Level:  Active  Participation Quality:  Appropriate  Affect:  Appropriate  Cognitive:  Appropriate  Insight:  Appropriate  Engagement in Group:  Supportive  Modes of Intervention:  Support  Summary of Progress/Problems: AA, pt was attentive during group session.  Sondra ComeWilson, Zachary Nole J 08/03/2013, 9:47 PM

## 2013-08-03 NOTE — Progress Notes (Signed)
Adult Psychoeducational Group Note  Date:  08/03/2013 Time:  4:56 PM  Group Topic/Focus:  Relapse Prevention Planning:   The focus of this group is to define relapse and discuss the need for planning to combat relapse.  Participation Level:  Appropriate  Participation Quality: Appropriate, Sharing, Supportive  Affect:  Appropriate  Cognitive: Good  Insight: Appropriate  Engagement in Group: Engaged  Modes of Intervention:  Discussion, Education, Socialization and Support  Additional Comments:  Pts discussed what relapse is to them, and how there are many different types of relapse such has falling back into old/unhealthy thought patterns/behaviors. Pts discussed different triggers that begin the start of their relapse and what they can do to replace those unhealthy behaviors once they identify their triggers and early warning signs. Pt stated one of his biggest triggers is old friends and "romancing alcohol". When asked what he could do when he started feeling this way to to change his environment and find new friends and to get a good support group so that he can call someone when he begins to "romance alcohol".  Caswell CorwinOwen, Tanja Gift C 08/03/2013, 4:56 PM

## 2013-08-03 NOTE — Progress Notes (Signed)
D: Patient's affect appropriate to circumstance and mood is anxious. He's attending groups and visible in the milieu. Patient compliant with current medication regimen.  A: Support and encouragement provided to patient. Scheduled medications administered per MD orders. Maintain Q15 minute checks for safety.  R: Patient receptive. Denies SI/HI and auditory/visual hallucinations. Patient remains safe.

## 2013-08-03 NOTE — Progress Notes (Signed)
Recreation Therapy Notes  Date: 01.23.2015 Time: 2:45pm Location: [address] Dayroom   Group Topic: Building Healthy Support System   Goal Area(s) Addresses:  Patient will identify qualities needed to build healthy support system. Patient will identify why those qualities are important.   Behavioral Response: Appropriate   Intervention: Scenario  Activity: Patients were asked to identify all qualities needed to build a healthy support system, as if they were equivalent to ingredients needed to make a cookie recipe.    Education:  Pharmacist, communityocial Skills, Building control surveyorDischarge Planning,   Education Outcome: Acknowledges understanding  Clinical Observations/Feedback: Patient arrived late to group session, upon arrival patient listened intently to group discussion. Group discussion focused around qualities needed for building a healthy support system, development of coping skills and the importance of trust and communication to relationships. Patient shared that in the past he has "put on armor" when someone has hurt him and it prevented him from being able to effectively communicate with anyone or work through problems effectively. Patient additionally shared that he has been able to remove some of his armor lately and this has positively changed his ability to work within relationships and his support system.   Benjamin Lindsey, LRT/CTRS  Jearl KlinefelterBlanchfield, Stiven Kaspar L 08/03/2013 4:27 PM

## 2013-08-03 NOTE — Tx Team (Signed)
Interdisciplinary Treatment Plan Update (Adult)  Date:  Time Reviewed: Progress in Treatment:  Attending groups: Yes  Participating in groups:  Yes Taking medication as prescribed: Yes  Tolerating medication: Yes  Family/Significant othe contact made: No. SPE not required for pt.  Patient understands diagnosis: Yes, AEB seeking detox and mood stabilization.  Discussing patient identified problems/goals with staff: Yes  Medical problems stabilized or resolved: Yes  Denies suicidal/homicidal ideation: Yes during admission, group/self report.  Patient has not harmed self or Others: Yes  New problem(s) identified: n/a  Discharge Plan or Barriers: Pt reports that [facility] would [facility]. Pt lives with his girlfriend and has positive supports including gf, and parents. He has 35 year old daughter that lives in [place].  Additional comments: Pt is a 33 year old male admitted with ETOH dependency and requesting detox and assist in finding appropriate long term treatment for him He drinks 12 to 15 beers daily and has just recently got out of jail from a situation which occurred because of excessive drinking He is pleasant and cooperative during the assessment He reports some mild anxiety and agitation but claims no other withdrawal symptoms He denies suicidal ideation and homicidal ideation Pt was oriented to the unit and offered nourishment Medications were administered and evaluated for effectiveness Q 15 min checks Pt safe at present and adjusting well Reason for Continuation of Hospitalization: [facility] taper-withdrawal  Mood stabilization Medication management  Estimated length of stay: 3-5 days  For review of initial/current patient goals, please see plan of care.  Attendees:  Patient:    Family:    Physician: Geoffery LyonsIrving Lugo MD 08/03/2013 9:49 AM   Nursing: Griffin Dakinonecia RN  08/03/2013 9:49 AM   Clinical Social Worker Mieko Kneebone Smart, LCSWA  08/03/2013 9:49 AM    Other: Chandra BatchAggie N. PA  08/03/2013 9:49 AM   Other:    Other: Darden DatesJennifer C. Nurse CM  08/03/2013 9:49 AM   Other:    Scribe for Treatment Team:  The Sherwin-WilliamsHeather Smart LCSWA 08/03/2013 9:49 AM

## 2013-08-03 NOTE — [facility] Group Notes (Signed)
[facility] LCSW Group Therapy  08/03/2013 3:02 PM  Type of Therapy:  Group Therapy   Participation Level:  Active  Participation Quality:  Appropriate  Affect:  Appropriate  Cognitive:  Appropriate  Insight:  Engaged  Engagement in Therapy:  Engaged  Modes of Intervention:  Discussion and Socialization   Summary of Progress/Problems:  Chaplain was here to lead a group on themes of hope and courage. Casimiro NeedleMichael came to group late.  He gave another patient a positive reframe, in which he stated "I don't see it as stubbornness, but as strength to survive."   He acknowledged that hope exists with his family, girlfriend, and pastor;  people who are show him compassion and empathy.   He also acknowledged experiencing those things here, and how that has increased his hope as well.  Simona Huhina Yang   08/03/2013  3:02 PM

## 2013-08-03 NOTE — Clinical Social Work Note (Signed)
CSW provided pt with letter for court. Pt plans to give this to his mother/father during visiting hours this evening. [facility] will not accept pt due to "current negotiations with [employer] insurance"-they are unable to accept him at this time. [facility] and pt asked to call [facility] this afternoon if CSW has not heard back. [facility] but would like to speak with his parents before he signs release consenting to referral.    Trula SladeHeather Smart, LCSWA 08/03/2013 1:36 PM
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
## 2013-08-04 DIAGNOSIS — F17200 Nicotine dependence, unspecified, uncomplicated: Secondary | ICD-10-CM | POA: Diagnosis present

## 2013-08-04 DIAGNOSIS — F1320 Sedative, hypnotic or anxiolytic dependence, uncomplicated: Secondary | ICD-10-CM | POA: Diagnosis present

## 2013-08-04 DIAGNOSIS — F411 Generalized anxiety disorder: Secondary | ICD-10-CM | POA: Diagnosis present

## 2013-08-04 DIAGNOSIS — F1020 Alcohol dependence, uncomplicated: Secondary | ICD-10-CM | POA: Diagnosis present

## 2013-08-04 MED ORDER — INFLUENZA VAC SPLIT QUAD 0.5 ML IM SUSP: 0.5000 mL | INTRAMUSCULAR | Status: DC

## 2013-08-04 NOTE — Progress Notes (Signed)
D.  Pt pleasant and appropriate on approach, denies complaints other than some anxiety and insomnia.  No s/s of withdrawal noted.  Positive for evening AA group, interacting appropriately within milieu.  Denies SI/HI/hallucinations at this time.  A.  Support and encouragement offered  R.  Pt remains safe on unit, will continue to monitor.

## 2013-08-04 NOTE — [facility] Group Notes (Signed)
[facility] LCSW Group Therapy Note  08/04/2013 10:05 - 11:00 AM  Type of Therapy and Topic:  Group Therapy: Avoiding Self-Sabotaging and Enabling Behaviors  Participation Level:  Active   Mood: Appropriate, with some anxiety  Description of Group:     Learn how to identify obstacles, self-sabotaging and enabling behaviors, what are they, why do we do them and what needs do these behaviors meet? Discuss unhealthy relationships and how to have positive healthy boundaries with those that sabotage and enable. Explore aspects of self-sabotage and enabling in yourself and how to limit these self-destructive behaviors in everyday life.  Therapeutic Goals: 1. Patient will identify one obstacle that relates to self-sabotage and enabling behaviors 2. Patient will identify one personal self-sabotaging or enabling behavior they did prior to admission 3. Patient able to establish a plan to change the above identified behavior they did prior to admission:  4. Patient will demonstrate ability to communicate their needs through discussion and/or role plays.   Summary of Patient Progress: The main focus of today's process group was to explain what "self-sabotage" means and use Motivational Interviewing to discuss what benefits, negative or positive, were involved in a self-identified self-sabotaging behavior. We then talked about reasons the patient may want to change the behavior and his current desire to change. Casimiro NeedleMichael shared that he is her by choice for help with alcohol.  Reports that he became willing to change after seeing his mother in tears following his release form jail. "That did it, I realized I was hurting others besides just myself."  Parents had refused to post bond.Patient later shared fear he will "get sober, things will improve and someday I'll forget all about this and drink again." This lead to good discussion about projection.   Therapeutic Modalities:   Cognitive Behavioral  Therapy Person-Centered Therapy Motivational Interviewing   Carney Bernatherine C Harrill, LCSW

## 2013-08-04 NOTE — Progress Notes (Signed)
D: pt is cooperative.denies si/hi/avh. Denies pain. Pt seen engaging with other appropriately in dayroom.  A: q 15 min safety checks. Support and encouragement offered R: pt remains safe on the unit no further complaints at this time

## 2013-08-04 NOTE — Progress Notes (Signed)
[facility] MD Progress Note  08/04/2013 2:30 PM TAG WURTZ  MRN:  [id number]  Subjective: Benjamin Lindsey is here for alcohol detox. He says he is doing okay. He adds that he does not feel the normal alcohol withdrawal symptoms that other people feel when withdrawing from alcohol. He says his is more anxiety and tiredness than anything else. He says his sleep is fair. He is hoping on getting into the [facility] treatment [facility] in  [place] since there are no available beds at the [facility]. He currently denies any SIHI, AVH.  Diagnosis:   DSM5: Schizophrenia Disorders:  NA Obsessive-Compulsive Disorders:  NA Trauma-Stressor Disorders:  NA Substance/Addictive Disorders:  Alcohol Related Disorder - Severe (303.90), Benzodiazepine related disorder Depressive Disorders:  NA  Axis I: Alcohol Related Disorder - Severe (303.90), Benzodiazepine related disorder Axis II: Deferred Axis III:  Past Medical History  Diagnosis Date  . Anxiety    Axis IV: other psychosocial or environmental problems and Polysubstance abuse Axis V: 41-50 serious symptoms  ADL's:  Fairly groomed  Sleep: Fair, 5.75 per documentation  Appetite:  Good  Suicidal Ideation:  Plan:  Denies Intent:  Denies Means:  Denies  Homicidal Ideation:  Plan:  Denies Intent:  Denies Means:  Denies  AEB (as evidenced by):  Psychiatric Specialty Exam: Review of Systems  Constitutional: Negative.   HENT: Negative.   Eyes: Negative.   Respiratory: Negative.   Cardiovascular: Negative.   Gastrointestinal: Negative.   Genitourinary: Negative.   Musculoskeletal: Negative.   Skin: Negative.   Neurological: Negative.   Endo/Heme/Allergies: Negative.   Psychiatric/Behavioral: Positive for substance abuse (Alcoholism, Benzodiazepine dependence). Negative for depression, suicidal ideas, hallucinations and memory loss. The patient is nervous/anxious (Currently on medication to stabilize) and has  insomnia (Currently on medication to stabilize).     Blood pressure 129/81, pulse 82, temperature 97.5 F (36.4 C), temperature source Oral, resp. rate 18, height 5\' 10"  (1.778 m), weight 77.111 kg (170 lb).Body mass index is 24.39 kg/(m^2).  General Appearance: Fairly Groomed  Patent attorney::  Fair  Speech:  Clear and Coherent  Volume:  Normal  Mood:  Anxious  Affect:  Congruent  Thought Process:  Coherent and Goal Directed  Orientation:  Full (Time, Place, and Person)  Thought Content:  Rumination and Dennies any hallucinations  Suicidal Thoughts:  No  Homicidal Thoughts:  No  Memory:  Immediate;   Good Recent;   Good Remote;   Good  Judgement:  Fair  Insight:  Fair  Psychomotor Activity:  Normal  Concentration:  Fair  Recall:  Good  Akathisia:  No  Handed:  Right  AIMS (if indicated):     Assets:  Communication Skills Desire for Improvement  Sleep:  Number of Hours: 5.75   Current Medications: Current Facility-Administered Medications  Medication Dose Route Frequency Provider Last Rate Last Dose  . acetaminophen (TYLENOL) tablet 650 mg  650 mg Oral Q6H PRN Audrea Muscat, NP   650 mg at 08/04/13 1308  . alum & mag hydroxide-simeth (MAALOX/MYLANTA) 200-200-20 MG/5ML suspension 30 mL  30 mL Oral Q4H PRN Evanna Janann August, NP      . chlordiazePOXIDE (LIBRIUM) capsule 25 mg  25 mg Oral Q6H PRN Rachael Fee, MD      . chlordiazePOXIDE (LIBRIUM) capsule 25 mg  25 mg Oral TID Rachael Fee, MD   25 mg at 08/04/13 1143   Followed by  . [START ON 08/05/2013] chlordiazePOXIDE (LIBRIUM) capsule 25  mg  25 mg Oral BH-qamhs Rachael FeeIrving A Lugo, MD       Followed by  . [START ON 08/06/2013] chlordiazePOXIDE (LIBRIUM) capsule 25 mg  25 mg Oral Daily Rachael FeeIrving A Lugo, MD      . folic acid (FOLVITE) tablet 1 mg  1 mg Oral Daily Evanna Janann Augustori Burkett, NP   1 mg at 08/04/13 0830  . hydrOXYzine (ATARAX/VISTARIL) tablet 25 mg  25 mg Oral Q6H PRN Rachael FeeIrving A Lugo, MD      . loperamide (IMODIUM) capsule  2-4 mg  2-4 mg Oral PRN Rachael FeeIrving A Lugo, MD      . magnesium hydroxide (MILK OF MAGNESIA) suspension 30 mL  30 mL Oral Daily PRN Evanna Janann Augustori Burkett, NP      . multivitamin with minerals tablet 1 tablet  1 tablet Oral Daily Evanna Janann Augustori Burkett, NP   1 tablet at 08/04/13 0831  . nicotine (NICODERM CQ - dosed in mg/24 hours) patch 21 mg  21 mg Transdermal Daily Rachael FeeIrving A Lugo, MD   21 mg at 08/04/13 1144  . ondansetron (ZOFRAN-ODT) disintegrating tablet 4 mg  4 mg Oral Q6H PRN Rachael FeeIrving A Lugo, MD      . thiamine (VITAMIN B-1) tablet 100 mg  100 mg Oral Daily Rachael FeeIrving A Lugo, MD   100 mg at 08/04/13 0831  . traZODone (DESYREL) tablet 50 mg  50 mg Oral QHS PRN,MR X 1 Evanna Cori Merry ProudBurkett, NP   50 mg at 08/03/13 2234    Lab Results: No results found for this or any previous visit (from the past 48 hour(s)).  Physical Findings: AIMS: Facial and Oral Movements Muscles of Facial Expression: None, normal Lips and Perioral Area: None, normal Jaw: None, normal Tongue: None, normal,Extremity Movements Upper (arms, wrists, hands, fingers): None, normal Lower (legs, knees, ankles, toes): None, normal, Trunk Movements Neck, shoulders, hips: None, normal, Overall Severity Severity of abnormal movements (highest score from questions above): None, normal Incapacitation due to abnormal movements: None, normal Patient's awareness of abnormal movements (rate only patient's report): No Awareness, Dental Status Current problems with teeth and/or dentures?: No Does patient usually wear dentures?: No  CIWA:  CIWA-Ar Total: 2 COWS:     Treatment Plan Summary: Daily contact with patient to assess and evaluate symptoms and progress in treatment Medication management  Plan: Review of chart, vital signs, medications, and notes. 1-Individual and group therapy 2-Medication management for anxiety/alcohol detox:  Medications reviewed with the patient, denies any adverse effects. 3-Coping skills for anxiety, and substance  dependency 4-Continue crisis stabilization and management 5-Address health issues--monitoring vital signs, stable 6-Treatment plan in progress to prevent relapse of depression, substance abuse, and anxiety  Medical Decision Making Problem Points:  Established problem, stable/improving (1), Review of last therapy session (1) and Review of psycho-social stressors (1) Data Points:  Review of medication regiment & side effects (2) Review of new medications or change in dosage (2)  I certify that inpatient services furnished can reasonably be expected to improve the patient's condition.   Armandina Stammerwoko, Agnes I, PMHNP-BC 08/04/2013, 2:30 PM  I have reviewed the note, discussed with the above provider, and agree with the assessment and plan. Continue detox protocol per CIWA.  Jacqulyn CaneSHAJI Darianne Muralles, M.D.  08/04/2013 9:05 PM

## 2013-08-04 NOTE — ED Provider Notes (Signed)
Medical screening examination/treatment/procedure(s) were performed by non-physician practitioner and as supervising physician I was immediately available for consultation/collaboration.  EKG Interpretation   None        Flint MelterElliott L Lonnel Gjerde, MD 08/04/13 [phone]

## 2013-08-04 NOTE — Progress Notes (Signed)
Patient did attend the evening speaker AA meeting.  

## 2013-08-04 NOTE — Progress Notes (Signed)
Patient ID: Benjamin CamelMichael A Lindsey, male   DOB: October 17, 1980, 33 y.o.   MRN: [id number] Pt visible in the milieu.  Interacting appropriately with staff and peers.  Attending groups.  Needs assessed.  Pt denied.  Support provided.  Fifteen minute checks in progress for patient safety.  Pt safe on unit.

## 2013-08-04 NOTE — Progress Notes (Signed)
Psychoeducational Group Note  Date:  08/04/2013 Time:  1315  Group Topic/Focus:  Identifying Needs:   The focus of this group is to help patients identify their personal needs that have been historically problematic and identify healthy behaviors to address their needs.  Participation Level:  Active  Participation Quality:  Appropriate and Attentive  Affect:  Appropriate  Cognitive:  Alert and Appropriate  Insight:  Developing/Improving  Engagement in Group:  Developing/Improving  Additional Comments:   Andrena Mewsuttall, Conda Wannamaker J 08/04/2013,2:37 PM

## 2013-08-05 MED ORDER — HYDROCORTISONE 1 % EX CREA
TOPICAL_CREAM | Freq: Two times a day (BID) | CUTANEOUS | Status: DC | PRN
  Administered 2013-08-05: 1 via TOPICAL
  Filled 2013-08-05 (×2): qty 1.5

## 2013-08-05 NOTE — Progress Notes (Signed)
Pt reported his sleep as fair appetite good energy normal and ability to pay attention good. He denies any depression hopelessness or anxiety on his self-inventory.  He denies any S/H ideation or AVH.  He is [facility] [place].  He has been up and engaged with the groups.

## 2013-08-05 NOTE — [facility] Group Notes (Signed)
[facility] Group Notes:  (Nursing/MHT/Case Management/Adjunct)  Date:  08/05/2013  Time:  1530  Type of Therapy:  Nurse Education  Participation Level:  Active  Participation Quality:  Appropriate and Attentive  Affect:  Appropriate  Cognitive:  Appropriate  Insight:  Improving  Engagement in Group:  Developing/Improving  Modes of Intervention:  Exploration  Summary of Progress/Problems:  Benjamin Lindsey, Benjamin Lindsey 08/05/2013, 4:40 PM

## 2013-08-05 NOTE — Progress Notes (Signed)
D.  Pt pleasant on approach, denies complaints at this time.  Positive for evening AA group, interacting appropriately within milieu.  Denies SI/HI/hallucinations at this time.  A.  Support and encouragement offered  R.  Pt remains safe on unit, will continue to monitor. 

## 2013-08-05 NOTE — Progress Notes (Signed)
Adult Psychoeducational Group Note  Date:  08/05/2013 Time:  0900  Group Topic/Focus:  Relapse Prevention Planning:   The focus of this group is to define relapse and discuss the need for planning to combat relapse.  Participation Level:  Active  Participation Quality:  Appropriate, Attentive, Sharing and Supportive  Affect:  Appropriate and Excited  Cognitive:  Alert and Appropriate  Insight: Good and Improving  Engagement in Group:  Engaged and Supportive  Modes of Intervention:  Discussion, Education and Exploration  Additional Comments:  Pt showed great deal of interest in topic and is developing insight into addiction/recovery.  Janina Trafton Shari Prowsvan 08/05/2013, 10:35 AM

## 2013-08-05 NOTE — Progress Notes (Signed)
Patient did attend the evening speaker AA meeting.  

## 2013-08-05 NOTE — [facility] Group Notes (Signed)
  [facility] LCSW Group Therapy Note   08/05/2013  10:00 To 11:00 AM   Type of Therapy and Topic: Group Therapy: Feelings Around Returning Home & Establishing a Supportive Framework and Activity to Identify signs of Improvement or Decompensation   Participation Level: Active  Mood: Appropriate  Description of Group:  Patients first processed thoughts and feelings about up coming discharge. These included fears of upcoming changes, lack of change, new living environments, judgements and expectations from others and overall stigma of MH issues. We then discussed what is a supportive framework? What does it look like feel like and how do I discern it from and unhealthy non-supportive network? Learn how to cope when supports are not helpful and don't support you. Discuss what to do when your family/friends are not supportive.   Therapeutic Goals Addressed in Processing Group:  1. Patient will identify one healthy supportive network that they can use at discharge. 2. Patient will identify one factor of a supportive framework and how to tell it from an unhealthy network. 3. Patient able to identify one coping skill to use when they do not have positive supports from others. 4. Patient will demonstrate ability to communicate their needs through discussion and/or role plays.  Summary of Patient Progress:  Pt engaged easily during group session. Patients processed their anxiety about discharge and described healthy supports. Benjamin NovemberMike shared that he plans to DC to inpatient treatment center and feels he will form relationships with others there who will be supportive upon completing the 28 day program.  When it was pointed out that he may need people in the community that he will be returning to pt seemed puzzled. Patient remains argumentative and monopolizing; feels certain he will drink in a few years.  Others in group pointed out that may be a self fulfilling prophecy and he was dismissive of their remarks.    Carney Bernatherine C Ghali Morissette, LCSW

## 2013-08-05 NOTE — Progress Notes (Signed)
Patient ID: Benjamin Lindsey, male   DOB: 1980/10/19, 33 y.o.   MR[facility]: [id number] [facility]  08/05/2013 4:10 PM Benjamin Lindsey  MRN:  [id number]  Subjective: Benjamin Lindsey reports that he feel relieved today because there is a possibility that [facility] may get into the [facility] [place]. He says the news came to him this morning and it will be confirmed tomorrow morning. He reported being hopeful and in a better mood as a result.  Diagnosis:   DSM5: Schizophrenia Disorders:  NA Obsessive-Compulsive Disorders:  NA Trauma-Stressor Disorders:  NA Substance/Addictive Disorders:  Alcohol Related Disorder - Severe (303.90), Benzodiazepine related disorder Depressive Disorders:  NA  Axis I: Alcohol Related Disorder - Severe (303.90), Benzodiazepine related disorder Axis II: Deferred Axis III:  Past Medical History  Diagnosis Date  . Anxiety    Axis IV: other psychosocial or environmental problems and Polysubstance abuse Axis V: 41-50 serious symptoms  ADL's:  Fairly groomed  Sleep: Fair, 5.75 per documentation  Appetite:  Good  Suicidal Ideation:  Plan:  Denies Intent:  Denies Means:  Denies  Homicidal Ideation:  Plan:  Denies Intent:  Denies Means:  Denies  AEB (as evidenced by):  Psychiatric Specialty Exam: Review of Systems  Constitutional: Negative.   HENT: Negative.   Eyes: Negative.   Respiratory: Negative.   Cardiovascular: Negative.   Gastrointestinal: Negative.   Genitourinary: Negative.   Musculoskeletal: Negative.   Skin: Negative.   Neurological: Negative.   Endo/Heme/Allergies: Negative.   Psychiatric/Behavioral: Positive for substance abuse (Alcoholism, Benzodiazepine dependence). Negative for depression, suicidal ideas, hallucinations and memory loss. The patient is nervous/anxious (Currently on medication to stabilize) and has insomnia (Currently on medication to stabilize).     Blood pressure 93/64, pulse 99, temperature 97.2 F (36.2 C),  temperature source Oral, resp. rate 16, height 5\' 10"  (1.778 m), weight 77.111 kg (170 lb).Body mass index is 24.39 kg/(m^2).  General Appearance: Fairly Groomed  Patent attorneyye Contact::  Fair  Speech:  Clear and Coherent  Volume:  Normal  Mood: "Hopeful"  Affect:  Congruent  Thought Process:  Coherent and Goal Directed  Orientation:  Full (Time, Place, and Person)  Thought Content:  Rumination and Dennies any hallucinations  Suicidal Thoughts:  No  Homicidal Thoughts:  No  Memory:  Immediate;   Good Recent;   Good Remote;   Good  Judgement:  Fair  Insight:  Fair  Psychomotor Activity:  Normal  Concentration:  Fair  Recall:  Good  Akathisia:  No  Handed:  Right  AIMS (if indicated):     Assets:  Communication Skills Desire for Improvement  Sleep:  Number of Hours: 6   Current Medications: Current Facility-Administered Medications  Medication Dose Route Frequency Provider Last Rate Last Dose  . acetaminophen (TYLENOL) tablet 650 mg  650 mg Oral Q6H PRN Audrea MuscatEvanna Cori Burkett, NP   650 mg at 08/04/13 [id number]  . alum & mag hydroxide-simeth (MAALOX/MYLANTA) 200-200-20 MG/5ML suspension 30 mL  30 mL Oral Q4H PRN Evanna Janann Augustori Burkett, NP      . chlordiazePOXIDE (LIBRIUM) capsule 25 mg  25 mg Oral Q6H PRN Rachael FeeIrving A Lugo, MD   25 mg at 08/04/13 2229  . chlordiazePOXIDE (LIBRIUM) capsule 25 mg  25 mg Oral BH-qamhs Rachael FeeIrving A Lugo, MD   25 mg at 08/05/13 0758   Followed by  . [START ON 08/06/2013] chlordiazePOXIDE (LIBRIUM) capsule 25 mg  25 mg Oral Daily Rachael FeeIrving A Lugo, MD      .  folic acid (FOLVITE) tablet 1 mg  1 mg Oral Daily Evanna Janann August, NP   1 mg at 08/05/13 0758  . hydrocortisone cream 1 %   Topical BID PRN Larena Sox, MD      . hydrOXYzine (ATARAX/VISTARIL) tablet 25 mg  25 mg Oral Q6H PRN Rachael Fee, MD   25 mg at 08/04/13 2229  . loperamide (IMODIUM) capsule 2-4 mg  2-4 mg Oral PRN Rachael Fee, MD      . magnesium hydroxide (MILK OF MAGNESIA) suspension 30 mL  30 mL Oral Daily  PRN Evanna Janann August, NP      . multivitamin with minerals tablet 1 tablet  1 tablet Oral Daily Evanna Janann August, NP   1 tablet at 08/05/13 0758  . nicotine (NICODERM CQ - dosed in mg/24 hours) patch 21 mg  21 mg Transdermal Daily Rachael Fee, MD   21 mg at 08/05/13 0759  . ondansetron (ZOFRAN-ODT) disintegrating tablet 4 mg  4 mg Oral Q6H PRN Rachael Fee, MD      . thiamine (VITAMIN B-1) tablet 100 mg  100 mg Oral Daily Rachael Fee, MD   100 mg at 08/05/13 0758  . traZODone (DESYREL) tablet 50 mg  50 mg Oral QHS PRN,MR X 1 Evanna Cori Merry Proud, NP   50 mg at 08/04/13 2229    Lab Results: No results found for this or any previous visit (from the past 48 hour(s)).  Physical Findings: AIMS: Facial and Oral Movements Muscles of Facial Expression: None, normal Lips and Perioral Area: None, normal Jaw: None, normal Tongue: None, normal,Extremity Movements Upper (arms, wrists, hands, fingers): None, normal Lower (legs, knees, ankles, toes): None, normal, Trunk Movements Neck, shoulders, hips: None, normal, Overall Severity Severity of abnormal movements (highest score from questions above): None, normal Incapacitation due to abnormal movements: None, normal Patient's awareness of abnormal movements (rate only patient's report): No Awareness, Dental Status Current problems with teeth and/or dentures?: No Does patient usually wear dentures?: No  CIWA:  CIWA-Ar Total: 0 COWS:  COWS Total Score: 0  Treatment Plan Summary: Daily contact with patient to assess and evaluate symptoms and progress in treatment Medication management  Plan: Review of chart, vital signs, medications, and notes. 1-Individual and group therapy 2-Medication management for anxiety/alcohol detox:  Medications reviewed with the patient, denies any adverse effects. 3-Coping skills for anxiety, and substance dependency 4-Continue crisis stabilization and management 5-Address health issues--monitoring vital  signs, stable 6-Treatment plan in progress to prevent relapse of depression, substance abuse, and anxiety. 7- Discharge plan in progress, possibly, [facility].  Medical Decision Making Problem Points:  Established problem, stable/improving (1), Review of last therapy session (1) and Review of psycho-social stressors (1) Data Points:  Review of medication regiment & side effects (2) Review of new medications or change in dosage (2)  I certify that inpatient services furnished can reasonably be expected to improve the patient's condition.   Armandina Stammer I, PMHNP-BC 08/05/2013, 4:10 PM   I have  reviewed the note, discussed with the above provider,  and agree with the above findings and plan with the following addition-  As patient uses hydrocortisone 1% cream for eczema, will continue the same in the hospital.  Jacqulyn Cane, M.D.  08/05/2013 10:07 PM

## 2013-08-05 NOTE — Progress Notes (Signed)
Adult Psychoeducational Group Note  Date:  08/05/2013 Time:  1:15PM  Group Topic/Focus:  Healthy Support Systems  Participation Level:  Active  Participation Quality:  Appropriate, Attentive and Sharing  Affect:  Appropriate  Cognitive:  Appropriate  Insight: Good  Engagement in Group:  Engaged  Modes of Intervention:  Discussion  Additional Comments:  The group session today focused on healthy support systems.  Staff asked Pt to identify their support system and indicate how they can become a better support to themselves. Pt indicated that his family and church were his support systems and explained that he could become a better support to himself by repaying them by remaining sober.   Zacarias PontesSmith, Billee Balcerzak R 08/05/2013, 3:42 PM

## 2013-08-05 NOTE — Progress Notes (Signed)
Benjamin Lindsey is seen out in the milieu today...tolerated fair. He makes poor eye contact. He is irritable at times and he makes jokes about being in the hospital.   A He requested MD order him hydrocortisone cream for c/o itching on his face. This was done and he is notiifed by Charity fundraiserN. He demonstrates minimal engagement in his recovery but tells this Clinical research associatewriter ( after his [facility] group today) that he knows he should have a better slef esteem,   R Safety is in place and poc includes continuing to foster therapeutic relationship with pt and help pt identify  positive attributes .

## 2013-08-06 MED ORDER — HYDROXYZINE HCL 25 MG PO TABS
25.0000 mg | ORAL_TABLET | Freq: Four times a day (QID) | ORAL | Status: DC | PRN
  Filled 2013-08-06: qty 30

## 2013-08-06 MED ORDER — TRAZODONE HCL 50 MG PO TABS: 50.0000 mg | ORAL_TABLET | Freq: Every evening | ORAL | Status: DC | PRN

## 2013-08-06 MED ORDER — HYDROXYZINE HCL 25 MG PO TABS: 25.0000 mg | ORAL_TABLET | Freq: Four times a day (QID) | ORAL | Status: DC | PRN

## 2013-08-06 NOTE — Progress Notes (Signed)
D/C instructions/meds/follow-up appointments reviewed, pt verbalized understanding, pt's belongings returned to pt, samples given. 

## 2013-08-06 NOTE — Progress Notes (Signed)
[facility] :  Will you be returning to the same living situation after discharge: No. [facility].  At discharge, do you have transportation home?:Yes,  [facility] 12[facility].  Do you have the ability to pay for your medications:Yes,  [employer]  Release of information consent forms completed and submitted to Medical Records by CSW.   Patient to Follow up at: Follow-up Information   Follow up with [facility] On 08/06/2013. (Scheduled for admission on this date. Driver will pick you up at 12:00PM)    Contact information:   [address] [place] [address] Phone: [phone] Fax: [phone]      Patient denies SI/HI:   Yes,  during admission, group, and self report.     Safety Planning and Suicide Prevention discussed:  Yes,  SPE not required for this pt. SPI pamphlet provided to pt and he was encouraged to share information with support network, ask questions, and talk about any concerns.   Smart, Christe Tellez LCSWA  08/06/2013, 10:48 AM

## 2013-08-06 NOTE — Discharge Summary (Signed)
Physician Discharge Summary Note  Patient:  Benjamin Lindsey is an 33 y.o., male MRN:  [id number] DOB:  03/05/1981 Patient phone:  [phone] (home)  Patient address:   [place] [address]   Date of Admission:  08/02/2013 Date of Discharge: 08/06/2013  Reason for Admission:  Alcohol detox/dependency  Discharge Diagnoses: Principal Problem:   Alcohol dependence Active Problems:   Benzodiazepine dependence   Anxiety state, unspecified  Axis Diagnosis:  Axis I:  Alcohol and benzodiazepine dependency; anxiety Axis II:  None Axis III:  None Axis IV:  Psychosocial stressors Axis V:  70; mild symptoms  Level of Care:  [facility]  Hospital Course:   On admission:  38 Y/ O male who states he was at a bar. He has had issues with the bartender before. He has been drinking was cut from any more alcohol. He step out, was trying to get back to the bar. Police were called. He was charged with braking and entering. Heavy drinker for 10 years. 10 pack to 18 to 30 beers on "Footbal Sundays". Has been drinking most everyday for 10 years. He abstained for 2 months encouraged by his parents "did not leave the house." states he craved it all along romanticized its use. Relapsed. States in [employer] he was shy, that caused some depression. Started drinking when he was 1 (liked not being afraid anymore) States he used to have some social anxiety. States alcohol help. Got to use cocaine early on, not anymore. Marijuana made the anxiety worst. That night his best friend did not want to pick him up and once taken to jail his parents did not want to put the $25 for bail. This was a wake up call. His sister picked him up. He agreed to get some help. He uses Klonopin and states he has had "horrible withdrawals"  During hospitalization:  [facility] alcohol detox protocol utilized.  Trazodone 50 mg at bedtime for sleep issues and Vistaril 25 mg every six hours PRN anxiety started.  His Klonopin was  not continued from his home medications.  Benjamin Lindsey attended and participated in therapy.  He denied suicidal/homicidal ideations and auditory/visual hallucinations, follow-up appointments encouraged to attend, outside support groups encouraged and information given, Rx given with a 14 day supply of medication for his rehab center.  Benjamin Lindsey is mentally and physically stable for discharge.  While a patient in this hospital, Benjamin Lindsey was enrolled in group counseling and activities as well as received the following medication Current facility-administered medications:acetaminophen (TYLENOL) tablet 650 mg, 650 mg, Oral, Q6H PRN, Malena Peer, NP, 650 mg at 08/04/13 3790;  alum & mag hydroxide-simeth (MAALOX/MYLANTA) 200-200-20 MG/5ML suspension 30 mL, 30 mL, Oral, Q4H PRN, Malena Peer, NP;  folic acid (FOLVITE) tablet 1 mg, 1 mg, Oral, Daily, Evanna Cori Burkett, NP, 1 mg at 08/06/13 2409 hydrocortisone cream 1 %, , Topical, BID PRN, Waldon Merl, MD, 1 application at 73/53/29 1814;  magnesium hydroxide (MILK OF MAGNESIA) suspension 30 mL, 30 mL, Oral, Daily PRN, Malena Peer, NP;  multivitamin with minerals tablet 1 tablet, 1 tablet, Oral, Daily, Evanna Glenda Chroman, NP, 1 tablet at 08/06/13 9242 nicotine (NICODERM CQ - dosed in mg/24 hours) patch 21 mg, 21 mg, Transdermal, Daily, Nicholaus Bloom, MD, 21 mg at 08/05/13 6834;  thiamine (VITAMIN B-1) tablet 100 mg, 100 mg, Oral, Daily, Nicholaus Bloom, MD, 100 mg at 08/06/13 0908;  traZODone (DESYREL) tablet 50 mg, 50 mg, Oral,  QHS PRN,MR X 1, Evanna Glenda Chroman, NP, 50 mg at 08/05/13 2234  Patient attended treatment team meeting this am and met with treatment team members. Pt symptoms, treatment plan and response to treatment discussed. Benjamin Lindsey endorsed that their symptoms have improved. Pt also stated that they are stable for discharge.  In other to control Principal Problem:   Alcohol dependence Active Problems:    Benzodiazepine dependence   Anxiety state, unspecified , they will continue psychiatric care on outpatient basis. They will follow-up at  Follow-up Information   Follow up with [facility]. (referral sent at 10:15AM on 08/03/13.)    Contact information:   [address] [place], [place] [address] Phone: [phone] Fax: [phone]      Follow up with [facility]. (Referral sent: 08/03/13 at 10:20:AM-pt insurance not currently accepted by facility (in negations for the next month))    Contact information:   [place] [address] Phone: [phone] Fax: [phone]      Follow up with [facility].   Contact information:   [address] [place], [place] [address] Phone: [phone] Fax: [phone]    .  In addition they were instructed to take all your medications as prescribed by your mental healthcare provider, to report any adverse effects and or reactions from your medicines to your outpatient provider promptly, patient is instructed and cautioned to not engage in alcohol and or illegal drug use while on prescription medicines, in the event of worsening symptoms, patient is instructed to call the crisis hotline, 911 and or go to the nearest ED for appropriate evaluation and treatment of symptoms.   Upon discharge, patient adamantly denies suicidal, homicidal ideations, auditory, visual hallucinations and or delusional thinking. They left [facility] with all personal belongings in no apparent distress.  Consults:  See electronic record for details  Significant Diagnostic Studies:  See electronic record for details  Discharge Vitals:   Blood pressure 133/85, pulse 88, temperature 97.6 F (36.4 C), temperature source Oral, resp. rate 16, height $RemoveBe'5\' 10"'PWgDPjekn$  (1.778 m), weight 170 lb (77.111 kg)..  Mental Status Exam: See Mental Status Examination and Suicide Risk Assessment completed by Attending Physician prior to discharge.  Discharge  destination:  Other:  [facility]  Is patient on multiple antipsychotic therapies at discharge:  No  Has Patient had three or more failed trials of antipsychotic monotherapy by history: N/A Recommended Plan for Multiple Antipsychotic Therapies: N/A Discharge Orders   Future Orders Complete By Expires   Activity as tolerated - No restrictions  As directed    Diet - low sodium heart healthy  As directed        Medication List    STOP taking these medications       clonazePAM 0.5 MG tablet  Commonly known as:  KLONOPIN      TAKE these medications     Indication   hydrOXYzine 25 MG tablet  Commonly known as:  ATARAX/VISTARIL  Take 1 tablet (25 mg total) by mouth every 6 (six) hours as needed for anxiety (or CIWA score </= 10).      traZODone 50 MG tablet  Commonly known as:  DESYREL  Take 1 tablet (50 mg total) by mouth at bedtime as needed and may repeat dose one time if needed for sleep.   Indication:  Trouble Sleeping           Follow-up Information   Follow up with [facility]. (referral sent at 10:15AM on 08/03/13.)  Contact information:   [address] [place], [place] [address] Phone: [phone] Fax: [phone]      Follow up with [facility]. (Referral sent: 08/03/13 at 10:20:AM-pt insurance not currently accepted by facility (in negations for the next month))    Contact information:   [place] [address] Phone: [phone] Fax: [phone]      Follow up with [facility].   Contact information:   [address] [place], [place] [address] Phone: [phone] Fax: [phone]     Follow-up recommendations:   Activities: Resume typical activities Diet: Resume typical diet Tests: none Other: Follow up with outpatient provider and report any side effects to out patient prescriber. Continue to work your relapse prevention plan Comments:   Take all your medications as prescribed by your mental  healthcare provider. Report any adverse effects and or reactions from your medicines to your outpatient provider promptly. Patient is instructed and cautioned to not engage in alcohol and or illegal drug use while on prescription medicines. In the event of worsening symptoms, patient is instructed to call the crisis hotline, 911 and or go to the nearest ED for appropriate evaluation and treatment of symptoms. Follow-up with your primary care provider for your other medical issues, concerns and or health care needs.  Total Discharge Time: Greater than 30 minutes  Signed: Waylan Boga, PMH-NP 08/06/2013 10:21 AM Agree with assessment and plan Geralyn Flash A. Sabra Heck, M.D.

## 2013-08-06 NOTE — [facility] Suicide Risk Assessment (Signed)
Suicide Risk Assessment  Discharge Assessment     Demographic Factors:  Male and Caucasian  Mental Status Per Nursing Assessment::   On Admission:     Current Mental Status by Physician: In full contact with reality. There are no suicidal ideas, plans or intent. His mood is euthymic, his affect is appropriate. There are no active S/S of withdrawal. He is willing and motivated to pursue residential  treatment. [facility] [place] [place]   Loss Factors: NA  Historical Factors: NA  Risk Reduction Factors:   Sense of responsibility to family, Employed, Living with another person, especially a relative and Positive social support  Continued Clinical Symptoms:  Alcohol/Substance Abuse/Dependencies  Cognitive Features That Contribute To Risk:  Closed-mindedness Polarized thinking Thought constriction (tunnel vision)    Suicide Risk:  Minimal: No identifiable suicidal ideation.  Patients presenting with no risk factors but with morbid ruminations; may be classified as minimal risk based on the severity of the depressive symptoms  Discharge Diagnoses:   AXIS I:  Alcohol Dependence, Benzodiazepine Dependence, Anxiety Disorder NOS AXIS II:  Deferred AXIS III:   Past Medical History  Diagnosis Date  . Anxiety    AXIS IV:  other psychosocial or environmental problems AXIS V:  61-70 mild symptoms  Plan Of Care/Follow-up recommendations:  Activity:  as tolerated Diet:  regular Follow up [facility] Is patient on multiple antipsychotic therapies at discharge:  No   Has Patient had three or more failed trials of antipsychotic monotherapy by history:  No  Recommended Plan for Multiple Antipsychotic Therapies: NA  Lilja Soland A 08/06/2013, 10:18 AM

## 2013-08-06 NOTE — Discharge Instructions (Signed)
Alcohol Problems °Most adults who drink alcohol drink in moderation (not a lot) are at low risk for developing problems related to their drinking. However, all drinkers, including low-risk drinkers, should know about the health risks connected with drinking alcohol. °RECOMMENDATIONS FOR LOW-RISK DRINKING  °Drink in moderation. Moderate drinking is defined as follows:  °· Men - no more than 2 drinks per day. °· Nonpregnant women - no more than 1 drink per day. °· Over age 65 - no more than 1 drink per day. °A standard drink is 12 grams of pure alcohol, which is equal to a 12 ounce bottle of beer or wine cooler, a 5 ounce glass of wine, or 1.5 ounces of distilled spirits (such as whiskey, brandy, vodka, or rum).  °ABSTAIN FROM (DO NOT DRINK) ALCOHOL: °· When pregnant or considering pregnancy. °· When taking a medication that interacts with alcohol. °· If you are alcohol dependent. °· A medical condition that prohibits drinking alcohol (such as ulcer, liver disease, or heart disease). °DISCUSS WITH YOUR CAREGIVER: °· If you are at risk for coronary heart disease, discuss the potential benefits and risks of alcohol use: Light to moderate drinking is associated with lower rates of coronary heart disease in certain populations (for example, men over age 45 and postmenopausal women). Infrequent or nondrinkers are advised not to begin light to moderate drinking to reduce the risk of coronary heart disease so as to avoid creating an alcohol-related problem. Similar protective effects can likely be gained through proper diet and exercise. °· Women and the elderly have smaller amounts of body water than men. As a result women and the elderly achieve a higher blood alcohol concentration after drinking the same amount of alcohol. °· Exposing a fetus to alcohol can cause a broad range of birth defects referred to as Fetal Alcohol Syndrome (FAS) or Alcohol-Related Birth Defects (ARBD). Although FAS/ARBD is connected with excessive  alcohol consumption during pregnancy, studies also have reported neurobehavioral problems in infants born to mothers reporting drinking an average of 1 drink per day during pregnancy. °· Heavier drinking (the consumption of more than 4 drinks per occasion by men and more than 3 drinks per occasion by women) impairs learning (cognitive) and psychomotor functions and increases the risk of alcohol-related problems, including accidents and injuries. °CAGE QUESTIONS:  °· Have you ever felt that you should Cut down on your drinking? °· Have people Annoyed you by criticizing your drinking? °· Have you ever felt bad or Guilty about your drinking? °· Have you ever had a drink first thing in the morning to steady your nerves or get rid of a hangover (Eye opener)? °If you answered positively to any of these questions: You may be at risk for alcohol-related problems if alcohol consumption is:  °· Men: Greater than 14 drinks per week or more than 4 drinks per occasion. °· Women: Greater than 7 drinks per week or more than 3 drinks per occasion. °Do you or your family have a medical history of alcohol-related problems, such as: °· Blackouts. °· Sexual dysfunction. °· Depression. °· Trauma. °· Liver dysfunction. °· Sleep disorders. °· Hypertension. °· Chronic abdominal pain. °· Has your drinking ever caused you problems, such as problems with your family, problems with your work (or school) performance, or accidents/injuries? °· Do you have a compulsion to drink or a preoccupation with drinking? °· Do you have poor control or are you unable to stop drinking once you have started? °· Do you have to drink to   avoid withdrawal symptoms? °· Do you have problems with withdrawal such as tremors, nausea, sweats, or mood disturbances? °· Does it take more alcohol than in the past to get you high? °· Do you feel a strong urge to drink? °· Do you change your plans so that you can have a drink? °· Do you ever drink in the morning to relieve  the shakes or a hangover? °If you have answered a number of the previous questions positively, it may be time for you to talk to your caregivers, family, and friends and see if they think you have a problem. Alcoholism is a chemical dependency that keeps getting worse and will eventually destroy your health and relationships. Many alcoholics end up dead, impoverished, or in prison. This is often the end result of all chemical dependency. °· Do not be discouraged if you are not ready to take action immediately. °· Decisions to change behavior often involve up and down desires to change and feeling like you cannot decide. °· Try to think more seriously about your drinking behavior. °· Think of the reasons to quit. °WHERE TO GO FOR ADDITIONAL INFORMATION  °· The National Institute on Alcohol Abuse and Alcoholism (NIAAA) °www.niaaa.nih.gov °· National Council on Alcoholism and Drug Dependence (NCADD) °www.ncadd.org °· American Society of Addiction Medicine (ASAM) °www.asam.org  °Document Released: 06/28/2005 Document Revised: 09/20/2011 Document Reviewed: 02/14/2008 °ExitCare® Patient Information ©2014 [employer]. ° °

## 2013-08-06 NOTE — [facility] Group Notes (Signed)
[facility]   08/06/2013 9:25 AM  Participation Quality:  Appropriate   Mood/Affect:  Appropriate   Depression Rating:  0  Anxiety Rating:  0  Thoughts of Suicide:  No Will you contract for safety?   NA  Current AVH:  No  Plan for Discharge/Comments:  Pt [facility]. [facility] reported that [facility] may be willing to work with him. [facility]. Pt [facility] willing [facility] consent [facility]. Pt can stay with his parents until admission into treatment program.   Transportation Means: parents     Supports: parents   Smart, [place] LCSWA

## 2013-08-09 NOTE — Progress Notes (Signed)
Patient Discharge Instructions:  After Visit Summary (AVS):   Faxed to:  08/09/13 Discharge Summary Note:   Faxed to:  08/09/13 Psychiatric Admission Assessment Note:   Faxed to:  08/09/13 Suicide Risk Assessment - Discharge Assessment:   Faxed to:  08/09/13 Faxed/Sent to the Next Level Care provider:  08/09/13 [facility]ax[facility] to [facility] @ [phone]  Jerelene ReddenSheena E [place], 08/09/2013, 2:37 PM

## 2015-04-01 ENCOUNTER — Emergency Department (INDEPENDENT_AMBULATORY_CARE_PROVIDER_SITE_OTHER)
Admission: EM | Admit: 2015-04-01 | Discharge: 2015-04-01 | Disposition: A | Discharge status: Home / Self Care | Payer: Self-pay | Source: Home / Self Care | Attending: Emergency Medicine | Admitting: Emergency Medicine

## 2015-04-01 ENCOUNTER — Encounter (HOSPITAL_COMMUNITY): Payer: Self-pay | Admitting: Emergency Medicine

## 2015-04-01 DIAGNOSIS — M7581 Other shoulder lesions, right shoulder: Secondary | ICD-10-CM

## 2015-04-01 DIAGNOSIS — B078 Other viral warts: Secondary | ICD-10-CM

## 2015-04-01 DIAGNOSIS — B079 Viral wart, unspecified: Secondary | ICD-10-CM

## 2015-04-01 MED ORDER — TRAMADOL HCL 50 MG PO TABS: 50.0000 mg | ORAL_TABLET | Freq: Four times a day (QID) | ORAL | Status: DC | PRN

## 2015-04-01 NOTE — Discharge Instructions (Signed)
Rotator Cuff Tendinitis  °Rotator cuff tendinitis is inflammation of the tough, cord-like bands that connect muscle to bone (tendons) in your rotator cuff. Your rotator cuff is the collection of all the muscles and tendons that connect your arm to your shoulder. Your rotator cuff holds the head of your upper arm bone (humerus) in the cup (fossa) of your shoulder blade (scapula). °CAUSES °Rotator cuff tendinitis is usually caused by overusing the joint involved.  °SIGNS AND SYMPTOMS °· Deep ache in the shoulder also felt on the outside upper arm over the shoulder muscle. °· Point tenderness over the area that is injured. °· Pain comes on gradually and becomes worse with lifting the arm to the side (abduction) or turning it inward (internal rotation). °· May lead to a chronic tear: When a rotator cuff tendon becomes inflamed, it runs the risk of losing its blood supply, causing some tendon fibers to die. This increases the risk that the tendon can fray and partially or completely tear. °DIAGNOSIS °Rotator cuff tendinitis is diagnosed by taking a medical history, performing a physical exam, and reviewing results of imaging exams. The medical history is useful to help determine the type of rotator cuff injury. The physical exam will include looking at the injured shoulder, feeling the injured area, and watching you do range-of-motion exercises. X-ray exams are typically done to rule out other causes of shoulder pain, such as fractures. MRI is the imaging exam usually used for significant shoulder injuries. Sometimes a dye study called CT arthrogram is done, but it is not as widely used as MRI. In some institutions, special ultrasound tests may also be used to aid in the diagnosis. °TREATMENT  °Less Severe Cases °· Use of a sling to rest the shoulder for a short period of time. Prolonged use of the sling can cause stiffness, weakness, and loss of motion of the shoulder joint. °· Anti-inflammatory medicines, such as  ibuprofen or naproxen sodium, may be prescribed. °More Severe Cases °· Physical therapy. °· Use of steroid injections into the shoulder joint. °· Surgery. °HOME CARE INSTRUCTIONS  °· Use a sling or splint until the pain decreases. Prolonged use of the sling can cause stiffness, weakness, and loss of motion of the shoulder joint. °· Apply ice to the injured area: °¨ Put ice in a plastic bag. °¨ Place a towel between your skin and the bag. °¨ Leave the ice on for 20 minutes, 2-3 times a day. °· Try to avoid use other than gentle range of motion while your shoulder is painful. Use the shoulder and exercise only as directed by your health care provider. Stop exercises or range of motion if pain or discomfort increases, unless directed otherwise by your health care provider. °· Only take over-the-counter or prescription medicines for pain, discomfort, or fever as directed by your health care provider. °· If you were given a shoulder sling and straps (immobilizer), do not remove it except as directed, or until you see a health care provider for a follow-up exam. If you need to remove it, move your arm as little as possible or as directed. °· You may want to sleep on several pillows at night to lessen swelling and pain. °SEEK IMMEDIATE MEDICAL CARE IF:  °· Your shoulder pain increases or new pain develops in your arm, hand, or fingers and is not relieved with medicines. °· You have new, unexplained symptoms, especially increased numbness in the hands or loss of strength. °· You develop any worsening of the problems   that brought you in for care.  Your arm, hand, or fingers are numb or tingling.  Your arm, hand, or fingers are swollen, painful, or turn white or blue. MAKE SURE YOU:  Understand these instructions.  Will watch your condition.  Will get help right away if you are not doing well or get worse. Document Released: 09/18/2003 Document Revised: 04/18/2013 Document Reviewed: 02/07/2013 [facility] Patient  Information 2015 [place], [place]. This information is not intended to replace advice given to you by your health care provider. Make sure you discuss any questions you have with your health care provider.  Repetitive Strain Injuries Repetitive strain injuries (RSIs) result from overuse or misuse of soft tissues including muscles, tendons, or nerves. Tendons are the cord-like structures that attach muscles to bones. RSIs can affect almost any part of the body. However, RSIs are most common in the arms (thumbs, wrists, elbows, shoulders) and legs (ankles, knees). Common medical conditions that are often caused by repetitive strain include carpal tunnel syndrome, tennis or golfer's elbow, bursitis, and tendonitis. If RSIs are treated early, and therepeated activity is reduced or removed, the severity and length of your problems can usually be reduced. RSIs are also called cumulative trauma disorders (CTD).  CAUSES  Many RSIs occur due to repeating the same activity at work over weeks or months without sufficient rest, such as prolonged typing. RSIs also commonly occur when a hobby or sport is done repeatedly without sufficient rest. RSIs can also occur due to repeated strain or stress on a body part in someone who has one or more risk factors for RSIs. RISK FACTORS Workplace risk factors  Frequent computer use, especially if your workstation is not adjusted for your body type.  Infrequent rest breaks.  Working in a high-pressure environment.  Working at a [employer].  Repeating the same motion, such as frequent typing.  Working in an awkward position or holding the same position for a long time.  Forceful movements such as lifting, pulling, or pushing.  Vibration caused by using power tools.  Working in cold temperatures.  Job stress. Personal risk factors  Poor posture.  Being loose-jointed.  Not exercising regularly.  Being overweight.  Arthritis, diabetes, thyroid problems, or  other long-term (chronic)medical conditions.  Vitamin deficiencies.  Keeping your fingernails long.  An unhealthy, stressful, or inactive lifestyle.  Not sleeping well. SYMPTOMS  Symptoms often begin at work but become more noticeable after the repeated stress has ended. For example, you may develop fatigue or soreness in your wrist while typingat work, and at night you may develop numbness and tingling in your fingers. Common symptoms include:   Burning, shooting, or aching pain, especially in the fingers, palms, wrists, forearms, or shoulders.  Tenderness.  Swelling.  Tingling, numbness, or loss of feeling.  Pain with certain activities, such as turning a doorknob or reaching above your head.  Weakness, heaviness, or loss of coordination in yourhand.  Muscle spasms or tightness. In some cases, symptoms can become so intense that it is difficult to perform everyday tasks. Symptoms that do not improve with rest may indicate a more serious condition.  DIAGNOSIS  Your caregiver may determine the type ofRSI you have based on your medical evaluation and a description of your activities.  TREATMENT  Treatment depends on the severity and type of RSI you have. Your caregiver may recommend rest for the affected body part, medicines, and physical or occupational therapy to reduce pain, swelling, and soreness. Discuss the activities you do  repeatedly with your caregiver. Your caregiver can help you decide whether you need to change your activities. An RSI may take months or years to heal, especially if the affected body part gets insufficient rest. In some cases, such as severe carpal tunnel syndrome, surgery may be recommended. PREVENTION  Talk with your supervisor to make sure you have the proper equipment for your work station.  Maintain good posture at your desk or work station with:  Feet flat on the floor.  Knees directly over the feet, bent at a right angle.  Lower back  supported by your chair or a cushion in the curve of your lower back.  Shoulders and arms relaxed and at your sides.  Neck relaxed and not bent forwards or backwards.  Your desk and computer workstation properly adjusted to your body type.  Your chair adjusted so there is no excess pressure on the back of your thighs.  The keyboard resting above your thighs. You should be able to reach the keys with your elbows at your side, bent at a right angle. Your arms should be supported on forearm rests, with your forearms parallel to the ground.  The computer mouse within easy reach.  The monitor directly in front of you, so that your eyes are aligned with the top of the screen. The screen should be about 15 to 25 inches from your eyes.  While typing, keep your wrist straight, in a neutral position. Move your entire arm when you move your mouse or when typing hard-to-reach keys.  Only use your computer as much as you need to for work. Do not use it during breaks.  Take breaks often from any repeated activity. Alternate with another task which requires you to use different muscles, or rest at least once every hour.  Change positions regularly. If you spend a lot of time sitting, get up, walk around, and stretch.  Do not hold pens or pencils tightly when writing.  Exercise regularly.  Maintain a normal weight.  Eat a diet with plenty of vegetables, whole grains, and fruit.  Get sufficient, restful sleep. HOME CARE INSTRUCTIONS  If your caregiver prescribed medicine to help reduce swelling, take it as directed.  Only take over-the-counter or prescription medicines for pain, discomfort, or fever as directed by your caregiver.  Reduce, and if needed, stopthe activities that are causing your problems until you have no further symptoms.If your symptoms are work-related, you may need to talk to your supervisor about changing your activities.  When symptoms develop, put ice or a cold pack on  the aching area.  Put ice in a plastic bag.  Place a towel between your skin and the bag.  Leave the ice on for 15-20 minutes.  If you were given a splint to keep your wrist from bending, wear it as instructed. It is important to wear the splint at night. Use the splint for as long as your caregiver recommends. SEEK MEDICAL CARE IF:  You develop new problems.  Your problems do not get better with medicine. MAKE SURE YOU:  Understand these instructions.  Will watch your condition.  Will get help right away if you are not doing well or get worse. Document Released: 06/18/2002 Document Revised: 12/28/2011 Document Reviewed: 08/19/2011 [facility] Patient Information 2015 [place], [place]. This information is not intended to replace advice given to you by your health care provider. Make sure you discuss any questions you have with your health care provider.  Warts Warts are a common  viral infection. They are most commonly caused by the human papillomavirus (HPV). Warts can occur at all ages. However, they occur most frequently in older children and infrequently in the elderly. Warts may be single or multiple. Location and size varies. Warts can be spread by scratching the wart and then scratching normal skin. The life cycle of warts varies. However, most will disappear over many months to a couple years. Warts commonly do not cause problems (asymptomatic) unless they are over an area of pressure, such as the bottom of the foot. If they are large enough, they may cause pain with walking. DIAGNOSIS  Warts are most commonly diagnosed by their appearance. Tissue samples (biopsies) are not required unless the wart looks abnormal. Most warts have a rough surface, are round, oval, or irregular, and are skin-colored to light yellow, brown, or gray. They are generally less than  inch (1.3 cm), but they can be any size. TREATMENT   Observation or no treatment.  Freezing with liquid nitrogen.  High  heat (cautery).  Boosting the body's immunity to fight off the wart (immunotherapy using Candida antigen).  Laser surgery.  Application of various irritants and solutions. HOME CARE INSTRUCTIONS  Follow your caregiver's instructions. No special precautions are necessary. Often, treatment may be followed by a return (recurrence) of warts. Warts are generally difficult to treat and get rid of. If treatment is done in a clinic setting, usually more than 1 treatment is required. This is usually done on only a monthly basis until the wart is completely gone. SEEK IMMEDIATE MEDICAL CARE IF: The treated skin becomes red, puffy (swollen), or painful. Document Released: 04/07/2005 Document Revised: 10/23/2012 Document Reviewed: 10/03/2009 [facility] Patient Information 2015 [place], [place]. This information is not intended to replace advice given to you by your health care provider. Make sure you discuss any questions you have with your health care provider.

## 2015-04-01 NOTE — ED Notes (Signed)
Patient reports right shoulder pain, increasing over time, no specific injury noted, history of playing sports.  Patient also concerned for wart / mole on left mid back.  Noticed over the past 2 months.  Patient has accidentally scratched site at onset.  Red skin around

## 2015-04-01 NOTE — ED Provider Notes (Signed)
CSN: [id number]     Arrival date & time 04/01/15  1759 History   First MD Initiated Contact with Patient 04/01/15 1921     Chief Complaint  Patient presents with  . Shoulder Pain  . Verrucous Vulgaris   (Consider location/radiation/quality/duration/timing/severity/associated sxs/prior Treatment) HPI Comments: 34 year old male complaining of right shoulder pain. It is located primarily to the anterior shoulder joint. He states that he dislocated his right shoulder about a year ago. He has not had problems since although in the last several weeks he has had pain to the right shoulder. 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 He has a history of left rotator cuff injury requiring surgery.  Second concern is that of a lesion to the left mid back. It is beginning to become sore.   Past Medical History  Diagnosis Date  . Anxiety    Past Surgical History  Procedure Laterality Date  . Rotator cuff repair     No family history on file. Social History  Substance Use Topics  . Smoking status: Current Every Day Smoker -- 1.00 packs/day    Types: Cigarettes  . Smokeless tobacco: None  . Alcohol Use: 0.0 oz/week    12-15 Cans of beer per week     Comment: heavily    Review of Systems  Constitutional: Negative.   HENT: Negative.   Respiratory: Negative.   Cardiovascular: Negative for chest pain.  Gastrointestinal: Negative.   Genitourinary: Negative.   Musculoskeletal: Positive for myalgias and arthralgias. Negative for joint swelling and neck pain.  Neurological: Negative.     Allergies  Review of patient's allergies indicates no known allergies.  Home Medications   Prior to Admission medications   Medication Sig Start Date End Date Taking? Authorizing Provider  hydrOXYzine (ATARAX/VISTARIL) 25 MG tablet Take 1 tablet (25 mg total) by mouth every 6 (six) hours as needed for anxiety (or CIWA score </= 10). 08/06/13    Charm Rings, NP  traMADol (ULTRAM) 50 MG tablet Take 1 tablet (50 mg total) by mouth every 6 (six) hours as needed. 04/01/15   Hayden Rasmussen, NP  traZODone (DESYREL) 50 MG tablet Take 1 tablet (50 mg total) by mouth at bedtime as needed and may repeat dose one time if needed for sleep. 08/06/13   Charm Rings, NP   Meds Ordered and Administered this Visit  Medications - No data to display  BP 140/93 mmHg  Pulse 77  Temp(Src) 98.9 F (37.2 C) (Oral)  Resp 18  SpO2 96% No data found.   Physical Exam  Constitutional: He appears well-developed and well-nourished. No distress.  Neck: Normal range of motion. Neck supple.  Pulmonary/Chest: Effort normal. No respiratory distress.  Musculoskeletal:  Right shoulder with complete range of motion with the exception of the ability to lift the right elbow upward while the right arm is across his chest. There is pain at the anterior joint. Otherwise, he is able to raise his arm or abduct it completely above his head. He is able to place his arm behind his back and across the shoulder. There is no tenderness to the deltoid muscle, the before meals joint or posterior joint line. There is mild tenderness to the ridge of the trapezius. No swelling, redness or other discoloration. Distal neurovascular and motor sensory is intact.  Neurological: He is alert. He exhibits normal muscle tone.  Skin: Skin is warm and dry.  The lesion  to the left mid back is a wart. No signs of melanoma. Well marginated lesion without leaching or vascularization.  Psychiatric: He has a normal mood and affect.  Nursing note and vitals reviewed.   ED Course  Procedures (including critical care time)  Labs Review Labs Reviewed - No data to display  Imaging Review No results found.   Visual Acuity Review  Right Eye Distance:   Left Eye Distance:   Bilateral Distance:    Right Eye Near:   Left Eye Near:    Bilateral Near:         MDM   1. Rotator cuff  tendinitis, right   2. Verruca vulgaris    Tramadol 50 mg #15 NSAIDS Ice Limit r arm stresses and work load. See maggie this week    Hayden Rasmussen, NP 04/01/15 2004

## 2016-11-26 ENCOUNTER — Emergency Department (HOSPITAL_BASED_OUTPATIENT_CLINIC_OR_DEPARTMENT_OTHER): Payer: Self-pay

## 2016-11-26 ENCOUNTER — Encounter (HOSPITAL_BASED_OUTPATIENT_CLINIC_OR_DEPARTMENT_OTHER): Payer: Self-pay | Admitting: Emergency Medicine

## 2016-11-26 ENCOUNTER — Emergency Department (HOSPITAL_BASED_OUTPATIENT_CLINIC_OR_DEPARTMENT_OTHER)
Admission: EM | Admit: 2016-11-26 | Discharge: 2016-11-27 | Disposition: A | Discharge status: Home / Self Care | Payer: Self-pay | Attending: Emergency Medicine | Admitting: Emergency Medicine

## 2016-11-26 DIAGNOSIS — S61051A Open bite of right thumb without damage to nail, initial encounter: Secondary | ICD-10-CM | POA: Insufficient documentation

## 2016-11-26 DIAGNOSIS — Z23 Encounter for immunization: Secondary | ICD-10-CM | POA: Insufficient documentation

## 2016-11-26 DIAGNOSIS — M5442 Lumbago with sciatica, left side: Secondary | ICD-10-CM | POA: Insufficient documentation

## 2016-11-26 DIAGNOSIS — Z79899 Other long term (current) drug therapy: Secondary | ICD-10-CM | POA: Insufficient documentation

## 2016-11-26 DIAGNOSIS — M545 Low back pain, unspecified: Secondary | ICD-10-CM

## 2016-11-26 DIAGNOSIS — G501 Atypical facial pain: Secondary | ICD-10-CM | POA: Insufficient documentation

## 2016-11-26 DIAGNOSIS — Y9389 Activity, other specified: Secondary | ICD-10-CM | POA: Insufficient documentation

## 2016-11-26 DIAGNOSIS — S61259A Open bite of unspecified finger without damage to nail, initial encounter: Secondary | ICD-10-CM

## 2016-11-26 DIAGNOSIS — Y92481 Parking lot as the place of occurrence of the external cause: Secondary | ICD-10-CM | POA: Insufficient documentation

## 2016-11-26 DIAGNOSIS — R51 Headache: Secondary | ICD-10-CM

## 2016-11-26 DIAGNOSIS — R519 Headache, unspecified: Secondary | ICD-10-CM

## 2016-11-26 DIAGNOSIS — W503XXA Accidental bite by another person, initial encounter: Secondary | ICD-10-CM

## 2016-11-26 DIAGNOSIS — F17210 Nicotine dependence, cigarettes, uncomplicated: Secondary | ICD-10-CM | POA: Insufficient documentation

## 2016-11-26 DIAGNOSIS — Y998 Other external cause status: Secondary | ICD-10-CM | POA: Insufficient documentation

## 2016-11-26 HISTORY — DX: Sedative, hypnotic or anxiolytic dependence, uncomplicated: F13.20

## 2016-11-26 HISTORY — DX: Alcohol abuse, uncomplicated: F10.10

## 2016-11-26 MED ORDER — TETANUS-DIPHTH-ACELL PERTUSSIS 5-2.5-18.5 LF-MCG/0.5 IM SUSP
0.5000 mL | Freq: Once | INTRAMUSCULAR | Status: AC
  Administered 2016-11-27: 0.5 mL via INTRAMUSCULAR
  Filled 2016-11-26: qty 0.5

## 2016-11-26 MED ORDER — KETOROLAC TROMETHAMINE 15 MG/ML IJ SOLN
15.0000 mg | Freq: Once | INTRAMUSCULAR | Status: AC
  Administered 2016-11-27: 15 mg via INTRAMUSCULAR
  Filled 2016-11-26: qty 1

## 2016-11-26 MED ORDER — CYCLOBENZAPRINE HCL 5 MG PO TABS
5.0000 mg | ORAL_TABLET | Freq: Once | ORAL | Status: AC
  Administered 2016-11-27: 5 mg via ORAL
  Filled 2016-11-26: qty 1
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
## 2016-11-27 MED ORDER — NAPROXEN 500 MG PO TABS: 500.0000 mg | ORAL_TABLET | Freq: Two times a day (BID) | ORAL | 0 refills | Status: DC

## 2016-11-27 MED ORDER — OXYCODONE-ACETAMINOPHEN 5-325 MG PO TABS
1.0000 | ORAL_TABLET | Freq: Once | ORAL | Status: AC
  Administered 2016-11-27: 1 via ORAL
  Filled 2016-11-27: qty 1

## 2016-11-27 MED ORDER — AMOXICILLIN-POT CLAVULANATE 875-125 MG PO TABS: 1.0000 | ORAL_TABLET | Freq: Two times a day (BID) | ORAL | 0 refills | Status: DC

## 2016-11-27 MED ORDER — AMOXICILLIN-POT CLAVULANATE 875-125 MG PO TABS
1.0000 | ORAL_TABLET | Freq: Once | ORAL | Status: AC
  Administered 2016-11-27: 1 via ORAL
  Filled 2016-11-27: qty 1

## 2016-11-27 MED ORDER — CYCLOBENZAPRINE HCL 5 MG PO TABS: 5.0000 mg | ORAL_TABLET | Freq: Two times a day (BID) | ORAL | 0 refills | Status: DC | PRN
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
## 2016-11-27 NOTE — ED Notes (Signed)
ED Provider at bedside. 

## 2016-12-24 ENCOUNTER — Other Ambulatory Visit: Payer: Self-pay

## 2016-12-24 ENCOUNTER — Emergency Department (HOSPITAL_BASED_OUTPATIENT_CLINIC_OR_DEPARTMENT_OTHER)
Admission: EM | Admit: 2016-12-24 | Discharge: 2016-12-24 | Disposition: A | Discharge status: Home / Self Care | Payer: Self-pay | Attending: Emergency Medicine | Admitting: Emergency Medicine

## 2016-12-24 ENCOUNTER — Encounter (HOSPITAL_BASED_OUTPATIENT_CLINIC_OR_DEPARTMENT_OTHER): Payer: Self-pay | Admitting: *Deleted

## 2016-12-24 DIAGNOSIS — Z791 Long term (current) use of non-steroidal anti-inflammatories (NSAID): Secondary | ICD-10-CM | POA: Insufficient documentation

## 2016-12-24 DIAGNOSIS — F17210 Nicotine dependence, cigarettes, uncomplicated: Secondary | ICD-10-CM | POA: Insufficient documentation

## 2016-12-24 DIAGNOSIS — R002 Palpitations: Secondary | ICD-10-CM | POA: Insufficient documentation

## 2016-12-24 DIAGNOSIS — Z0289 Encounter for other administrative examinations: Secondary | ICD-10-CM | POA: Insufficient documentation

## 2016-12-24 DIAGNOSIS — Z7689 Persons encountering health services in other specified circumstances: Secondary | ICD-10-CM

## 2016-12-24 DIAGNOSIS — Z79899 Other long term (current) drug therapy: Secondary | ICD-10-CM | POA: Insufficient documentation

## 2016-12-24 NOTE — ED Provider Notes (Signed)
[facility]   CSN: [id number] Arrival date & time: 12/24/16  1523   By signing my name below, I, [name], attest that this documentation has been prepared under the direction and in the presence of Melburn Hake, PA-C Electronically Signed: [name], ED Scribe. 12/24/16. 4:55 PM.  History   Chief Complaint Chief Complaint  Patient presents with  . Emesis  . Palpitations    HPI Benjamin Lindsey is a 36 y.o. male with a PMHx of alcohol abuse, who presents to the Emergency Department requesting a work note. Pt reports hx of alcoholism and notes he drank too much 2 nights ago which resulting in him not feeling well and having mild N/V resulting in him missing work. Reports he tried to return to work today but his boss told him he needed a work in order to return.  Pt states that he consumed 12 beers today and he typically consumes 8-12 beers daily. He notes that he has been in an ETOH rehab facility in [place] in the past. Denies illegal drug use.  He denies nausea, vomiting, diarrhea, fever, CP, SOB, leg swelling, lightheadedness, dizziness, abdominal pain, and any other symptoms. He notes he has been tolerating PO today.   Pt also reports having an episode of intermittent palpitations that occurred while he walked into the ED. He reports having history of similar episodes over the past year and denies any significant changes. He currently denies any palpitations, chest pain, shortness of breath or associated symptoms. Pt has not tried any medications for the relief of his symptoms.     The history is provided by the patient. No language interpreter was used.    Past Medical History:  Diagnosis Date  . Alcohol abuse   . Anxiety   . Benzodiazepine dependence [facility])     Patient Active Problem List   Diagnosis Date Noted  . Alcohol dependence (HCC) 08/03/2013  . Benzodiazepine dependence (HCC) 08/03/2013  . Anxiety state, unspecified 08/03/2013    Past  Surgical History:  Procedure Laterality Date  . ROTATOR CUFF REPAIR         Home Medications    Prior to Admission medications   Medication Sig Start Date End Date Taking? Authorizing Provider  clonazePAM (KLONOPIN) 0.5 MG tablet Take 0.5 mg by mouth 5 (five) times daily.   Yes [provider]  sertraline (ZOLOFT) 50 MG tablet Take 50 mg by mouth daily.   Yes [provider]  amoxicillin-clavulanate (AUGMENTIN) 875-125 MG tablet Take 1 tablet by mouth every 12 (twelve) hours. 11/27/16   Horton, Mayer Masker, MD  cyclobenzaprine (FLEXERIL) 5 MG tablet Take 1 tablet (5 mg total) by mouth 2 (two) times daily as needed for muscle spasms. 11/27/16   Horton, Mayer Masker, MD  hydrOXYzine (ATARAX/VISTARIL) 25 MG tablet Take 1 tablet (25 mg total) by mouth every 6 (six) hours as needed for anxiety (or CIWA score </= 10). 08/06/13   Charm Rings, NP  naproxen (NAPROSYN) 500 MG tablet Take 1 tablet (500 mg total) by mouth 2 (two) times daily. 11/27/16   Horton, Mayer Masker, MD  traMADol (ULTRAM) 50 MG tablet Take 1 tablet (50 mg total) by mouth every 6 (six) hours as needed. 04/01/15   Hayden Rasmussen, NP  traZODone (DESYREL) 50 MG tablet Take 1 tablet (50 mg total) by mouth at bedtime as needed and may repeat dose one time if needed for sleep. 08/06/13   Charm Rings, NP  Family History No family history on file.  Social History Social History  Substance Use Topics  . Smoking status: Current Every Day Smoker    Packs/day: 1.00    Types: Cigarettes  . Smokeless tobacco: Never Used  . Alcohol use 0.0 oz/week    12 - 15 Cans of beer per week     Comment: heavily     Allergies   Patient has no known allergies.   Review of Systems Review of Systems  Constitutional: Negative for fever.  Respiratory: Negative for cough and shortness of breath.   Cardiovascular: Positive for palpitations. Negative for chest pain and leg swelling.  Gastrointestinal: Negative for abdominal  pain.  Neurological: Negative for dizziness and light-headedness.  All other systems reviewed and are negative.    Physical Exam Updated Vital Signs BP (!) 138/98   Pulse (!) 105   Temp 99.2 F (37.3 C) (Oral)   Resp 20   Ht 5\' 11"  (1.803 m)   Wt 220 lb (99.8 kg)   SpO2 96%   BMI 30.68 kg/m   Physical Exam  Constitutional: He is oriented to person, place, and time. He appears well-developed and well-nourished. No distress.  HENT:  Head: Normocephalic and atraumatic.  Eyes: Conjunctivae and EOM are normal. Right eye exhibits no discharge. Left eye exhibits no discharge. No scleral icterus.  Neck: Normal range of motion. Neck supple.  Cardiovascular: Normal rate, regular rhythm, normal heart sounds and intact distal pulses.   HR 89  Pulmonary/Chest: Effort normal and breath sounds normal. No respiratory distress. He has no wheezes. He has no rales. He exhibits no tenderness.  Abdominal: Soft. Bowel sounds are normal. He exhibits no distension and no mass. There is no tenderness. There is no rebound and no guarding. No hernia.  Musculoskeletal: Normal range of motion. He exhibits no edema.  Neurological: He is alert and oriented to person, place, and time.  Skin: Skin is warm. He is not diaphoretic.  Nursing note and vitals reviewed.    ED Treatments / Results  DIAGNOSTIC STUDIES: Oxygen Saturation is 96% on RA, nl by my interpretation.    COORDINATION OF CARE: 4:37 PM Discussed treatment plan with pt at bedside which includes EKG and pt agreed to plan.  4:54 PM- RN informed PA that the pt eloped and ambulated out the front door stating "There is nothing wrong with me."   4:57 PM- RN informed PA that the pt returned to the ED after being visualized ambulating out the department.   Labs (all labs ordered are listed, but only abnormal results are displayed) Labs Reviewed - No data to display  EKG  EKG Interpretation None       Radiology No results  found.  Procedures Procedures (including critical care time)  Medications Ordered in ED Medications - No data to display   Initial Impression / Assessment and Plan / ED Course  I have reviewed the triage vital signs and the nursing notes.  Pertinent labs & imaging results that were available during my care of the patient were reviewed by me and considered in my medical decision making (see chart for details).     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 Also reports having intermittent palpitations which have been present for the few years. Denies any recent changes or other associated sxs. Denies having any sxs upon my initial evaluation. VSS. Exam unremarkable. EKG showed sinus rhythm with no acute ischemic changes. Pt has remained  hemodynamically stable while in the ED. Discussed outpatient rehab for EtOH abuse but pt reports he is not ready to quit. Pt given work note and d/c home.   Final Clinical Impressions(s) / ED Diagnoses   Final diagnoses:  Return to work evaluation    New Prescriptions New Prescriptions   No medications on file   I personally performed the services described in this documentation, which was scribed in my presence. The recorded information has been reviewed and is accurate.     Barrett Henleadeau, Baylyn Sickles Elizabeth, PA-C 12/24/16 1719    Geoffery Lyonselo, Douglas, MD 12/24/16 2124

## 2016-12-24 NOTE — ED Notes (Addendum)
Anxious, tearful, denies pain. States missed work last night and needs a note to return. Admits to daily ETOH use. Reassurance given

## 2016-12-24 NOTE — ED Triage Notes (Signed)
States he needs a doctors note to return to work. He called out of work sick today.

## 2016-12-24 NOTE — ED Notes (Signed)
Pt quickly exited the building; gait steady.

## 2016-12-24 NOTE — ED Notes (Signed)
Patient claimed that he drank 12 bottles of beer.  He stated that his face is flushed when he drinks alcohol.

## 2016-12-24 NOTE — ED Notes (Addendum)
Second EKG repeat Per PA Jim DesanctisNadeau

## 2016-12-24 NOTE — ED Notes (Addendum)
Patient came to the front desk on his street clothes and informed me that he is ready to go home because there is nothing wrong with him.  Informed him that please wait for the MD to finish his discharge paper and he obediently followed.   Triage RN came to me and informed me that patient left.  NP notified.    Patient came back and stated that he just went outside to smoke.  He was informed by another nurse that this is a smoke-free facility.  I asked him if he wants a nicotine patch which he declined.  NP informed that patient did not leave and he is back in his room.

## 2017-01-06 ENCOUNTER — Emergency Department (HOSPITAL_BASED_OUTPATIENT_CLINIC_OR_DEPARTMENT_OTHER)
Admission: EM | Admit: 2017-01-06 | Discharge: 2017-01-07 | Disposition: A | Discharge status: Home / Self Care | Payer: Self-pay | Attending: Emergency Medicine | Admitting: Emergency Medicine

## 2017-01-06 ENCOUNTER — Encounter (HOSPITAL_BASED_OUTPATIENT_CLINIC_OR_DEPARTMENT_OTHER): Payer: Self-pay | Admitting: *Deleted

## 2017-01-06 DIAGNOSIS — M62830 Muscle spasm of back: Secondary | ICD-10-CM | POA: Insufficient documentation

## 2017-01-06 DIAGNOSIS — M5442 Lumbago with sciatica, left side: Secondary | ICD-10-CM | POA: Insufficient documentation

## 2017-01-06 DIAGNOSIS — G8929 Other chronic pain: Secondary | ICD-10-CM | POA: Insufficient documentation

## 2017-01-06 DIAGNOSIS — F17210 Nicotine dependence, cigarettes, uncomplicated: Secondary | ICD-10-CM | POA: Insufficient documentation

## 2017-01-06 DIAGNOSIS — Z79899 Other long term (current) drug therapy: Secondary | ICD-10-CM | POA: Insufficient documentation

## 2017-01-06 LAB — URINALYSIS, ROUTINE W REFLEX MICROSCOPIC
BILIRUBIN URINE: NEGATIVE
Glucose, UA: NEGATIVE mg/dL
Hgb urine dipstick: NEGATIVE
Ketones, ur: NEGATIVE mg/dL
Leukocytes, UA: NEGATIVE
Nitrite: NEGATIVE
PROTEIN: NEGATIVE mg/dL
Specific Gravity, Urine: 1.002 — ABNORMAL LOW (ref 1.005–1.030)
pH: 5.5 (ref 5.0–8.0)

## 2017-01-06 LAB — ETHANOL: ALCOHOL ETHYL (B): 249 mg/dL — AB (ref <5)

## 2017-01-06 LAB — RAPID URINE DRUG SCREEN, HOSP PERFORMED
Amphetamines: NOT DETECTED
Barbiturates: NOT DETECTED
Benzodiazepines: NOT DETECTED
Cocaine: NOT DETECTED
Opiates: NOT DETECTED
Tetrahydrocannabinol: NOT DETECTED

## 2017-01-06 NOTE — ED Provider Notes (Signed)
[facility] Provider Note   CSN: [id number] Arrival date & time: 01/06/17  2009  By signing my name below, I, Benjamin Lindsey, attest that this documentation has been prepared under the direction and in the presence of non-physician practitioner, Mathews Robinsons, PA-C. Electronically Signed: Modena Lindsey, Scribe. 01/06/2017. 11:49 PM.  History   Chief Complaint Chief Complaint  Patient presents with  . Back Pain   The history is provided by the patient. No language interpreter was used.   HPI Comments: Benjamin Lindsey is a 36 y.o. male who presents to the Emergency Department complaining of constant moderate lower back pain that started about a month ago. He strains his back frequently at work. He was given Tylenol PTA with no relief. He describes the pain as sharp/stabbing and shooting into his lower left buttock. No hx of cancer or IV drug use.   He also complains of blood in stool that has been ongoing. No prior evaluation for this complaint. Denies any fever, chills, rectal pain, diarrhea, difficulty urinating, hematuria, inability to ambulate, numbness, saddle paresthesias, weakness, or other complaints at this time.  Past Medical History:  Diagnosis Date  . Alcohol abuse   . Anxiety   . Benzodiazepine dependence [facility])     Patient Active Problem List   Diagnosis Date Noted  . Alcohol dependence (HCC) 08/03/2013  . Benzodiazepine dependence (HCC) 08/03/2013  . Anxiety state, unspecified 08/03/2013    Past Surgical History:  Procedure Laterality Date  . ROTATOR CUFF REPAIR         Home Medications    Prior to Admission medications   Medication Sig Start Date End Date Taking? Authorizing Provider  amoxicillin-clavulanate (AUGMENTIN) 875-125 MG tablet Take 1 tablet by mouth every 12 (twelve) hours. 11/27/16   Horton, Mayer Masker, MD  clonazePAM (KLONOPIN) 0.5 MG tablet Take 0.5 mg by mouth 5 (five) times daily.    [provider]  cyclobenzaprine  (FLEXERIL) 5 MG tablet Take 1 tablet (5 mg total) by mouth 2 (two) times daily as needed for muscle spasms. 11/27/16   Horton, Mayer Masker, MD  hydrOXYzine (ATARAX/VISTARIL) 25 MG tablet Take 1 tablet (25 mg total) by mouth every 6 (six) hours as needed for anxiety (or CIWA score </= 10). 08/06/13   Charm Rings, NP  methocarbamol (ROBAXIN) 500 MG tablet Take 1 tablet (500 mg total) by mouth 2 (two) times daily. 01/07/17   Mathews Robinsons B, PA-C  naproxen (NAPROSYN) 500 MG tablet Take 1 tablet (500 mg total) by mouth 2 (two) times daily. 11/27/16   Horton, Mayer Masker, MD  predniSONE (DELTASONE) 10 MG tablet Take 4 tablets (40 mg total) by mouth daily. 01/07/17 01/11/17  Mathews Robinsons B, PA-C  sertraline (ZOLOFT) 50 MG tablet Take 50 mg by mouth daily.    [provider]  traMADol (ULTRAM) 50 MG tablet Take 1 tablet (50 mg total) by mouth every 6 (six) hours as needed. 04/01/15   Hayden Rasmussen, NP  traZODone (DESYREL) 50 MG tablet Take 1 tablet (50 mg total) by mouth at bedtime as needed and may repeat dose one time if needed for sleep. 08/06/13   Charm Rings, NP    Family History No family history on file.  Social History Social History  Substance Use Topics  . Smoking status: Current Every Day Smoker    Packs/day: 1.00    Types: Cigarettes  . Smokeless tobacco: Never Used  . Alcohol use 0.0 oz/week    12 -  15 Cans of beer per week     Comment: heavily     Allergies   Patient has no known allergies.   Review of Systems Review of Systems  Constitutional: Negative for chills and fever.  Respiratory: Negative for cough, shortness of breath, wheezing and stridor.   Cardiovascular: Negative for chest pain.  Gastrointestinal: Positive for blood in stool. Negative for abdominal distention, abdominal pain, diarrhea, nausea and vomiting.       No loss of bowel function. Patient states that he's been having some episodes where he sees bright red blood in the toilet bowl after he  defecates but not all the time.  Genitourinary: Negative for difficulty urinating, dysuria, flank pain and hematuria.       No loss of bladder function  Musculoskeletal: Positive for back pain and myalgias. Negative for arthralgias, gait problem, joint swelling, neck pain and neck stiffness.  Skin: Negative for color change, pallor and rash.  Neurological: Negative for weakness, light-headedness and numbness.     Physical Exam Updated Vital Signs BP 125/87   Pulse 77   Temp 98.3 F (36.8 C) (Oral)   Resp (!) 23   Ht 5\' 11"  (1.803 m)   Wt 99.8 kg (220 lb)   SpO2 97%   BMI 30.68 kg/m   Physical Exam  Constitutional: He appears well-developed and well-nourished. No distress.  Afebrile, nontoxic-appearing, lying comfortably in bed in no acute distress.  HENT:  Head: Normocephalic and atraumatic.  Eyes: Conjunctivae and EOM are normal.  Neck: Normal range of motion.  Cardiovascular: Normal rate, regular rhythm, normal heart sounds and intact distal pulses.   Pulmonary/Chest: Effort normal and breath sounds normal. No respiratory distress. He has no wheezes. He has no rales.  Abdominal: Soft. He exhibits no distension.  Musculoskeletal: Normal range of motion. He exhibits tenderness. He exhibits no edema or deformity.  No midline tenderness of the entire spine. TTP to the lower left lumbar region into the gluteal. Observed ambulation to the bathroom without difficulty. Normal stance and gait.   Neurological: He is alert. No sensory deficit. He exhibits normal muscle tone.  Neurovascularly intact in lower extremities bilaterally  Skin: Skin is warm and dry. No rash noted. He is not diaphoretic. No erythema. No pallor.  Psychiatric: He has a normal mood and affect.  Nursing note and vitals reviewed.    ED Treatments / Results  DIAGNOSTIC STUDIES: Oxygen Saturation is 96% on RA, normal by my interpretation.    COORDINATION OF CARE: 11:53 PM- Pt advised of plan for treatment and  pt agrees.  Labs (all labs ordered are listed, but only abnormal results are displayed) Labs Reviewed  URINALYSIS, ROUTINE W REFLEX MICROSCOPIC - Abnormal; Notable for the following:       Result Value   Specific Gravity, Urine 1.002 (*)    All other components within normal limits  ETHANOL - Abnormal; Notable for the following:    Alcohol, Ethyl (B) 249 (*)    All other components within normal limits  RAPID URINE DRUG SCREEN, HOSP PERFORMED  POC OCCULT BLOOD, ED    EKG  EKG Interpretation  Date/Time:  Thursday January 06 2017 21:48:01 EDT Ventricular Rate:  75 PR Interval:  190 QRS Duration: 86 QT Interval:  410 QTC Calculation: 457 R Axis:   18 Text Interpretation:  Normal sinus rhythm Normal ECG Confirmed by Rolland PorterJames, Mark ([id number]) on 01/06/2017 10:50:31 PM       Radiology No results found.  Procedures Procedures (including  critical care time)  Medications Ordered in ED Medications  predniSONE (DELTASONE) tablet 60 mg (60 mg Oral Given 01/07/17 0016)  methocarbamol (ROBAXIN) tablet 500 mg (500 mg Oral Given 01/07/17 0016)     Initial Impression / Assessment and Plan / ED Course  I have reviewed the triage vital signs and the nursing notes.  Pertinent labs & imaging results that were available during my care of the patient were reviewed by me and considered in my medical decision making (see chart for details).    Patient with Chronic back pain.  Likely a muscle strain to his lower back with muscle spasm. No neurological deficits and normal neuro exam.  Patient can ambulate without difficulties.  No loss of bowel or bladder control.  No concern for cauda equina.  No fever, night sweats, weight loss, h/o cancer, IVDU.  RICE protocol and pain medicine indicated and discussed with patient.  Patient presents with chronic low back pain with left-sided sciatica. I believe that muscle spasm is contributing to his discomfort and patient was given prednisone and Robaxin while in  the emergency department. He was ambulating well. Patient improved while in the ED.  Will discharge home with symptomatic relief and close follow-up with primary care. He states that he will be having a primary care provider starting in July.  Regarding his hematochezia, I suspect he may have internal hemorrhoids and ordered Hemoccult rectal exam but patient refused. Urged patient to follow-up with his provider to further evaluate. Attempted to convince patient to have rectal exam tonight but he continued to refuse.  Discussed strict return precautions and advised to return to the emergency department if experiencing any new or worsening symptoms. Instructions were understood and patient agreed with discharge plan.    Final Clinical Impressions(s) / ED Diagnoses   Final diagnoses:  Chronic left-sided low back pain with left-sided sciatica  Muscle spasm of back    New Prescriptions Discharge Medication List as of 01/07/2017 12:38 AM     I personally performed the services described in this documentation, which was scribed in my presence. The recorded information has been reviewed and is accurate.     Gregary Cromer 01/07/17 0114    Rolland Porter, MD 01/14/17 2103

## 2017-01-06 NOTE — ED Triage Notes (Signed)
States he is having lower back pain. Pain x 2 weeks. Speech is slurred.

## 2017-01-06 NOTE — ED Notes (Signed)
Pt significant other out to nurses station inquiring about the wait. She states she is worried the patient will be too sleepy when the doctor comes in to see him. She is asking if we have something we can put under his nose to smell to wake him up. She says that he only had one beer today, has been in the sun all day and works third shift so he has not slept today. Discussed wait at this time, suggested turning on lights and repositioning pt at this time. On initial assessment pt is very sleepy, falling asleep during questions, does not open eyes and answering only some questions.

## 2017-01-06 NOTE — ED Notes (Signed)
States he is having chest pain while waiting to go to a room.

## 2017-01-06 NOTE — ED Notes (Signed)
ED Provider at bedside. 

## 2017-01-07 MED ORDER — PREDNISONE 10 MG PO TABS: 40.0000 mg | ORAL_TABLET | Freq: Every day | ORAL | 0 refills | Status: DC

## 2017-01-07 MED ORDER — METHOCARBAMOL 500 MG PO TABS: 500.0000 mg | ORAL_TABLET | Freq: Two times a day (BID) | ORAL | 0 refills | Status: DC

## 2017-01-07 MED ORDER — PREDNISONE 50 MG PO TABS
60.0000 mg | ORAL_TABLET | Freq: Once | ORAL | Status: AC
  Administered 2017-01-07: 60 mg via ORAL
  Filled 2017-01-07: qty 1

## 2017-01-07 MED ORDER — METHOCARBAMOL 500 MG PO TABS
500.0000 mg | ORAL_TABLET | Freq: Once | ORAL | Status: AC
  Administered 2017-01-07: 500 mg via ORAL
  Filled 2017-01-07: qty 1

## 2017-01-07 MED ORDER — PREDNISONE 10 MG PO TABS: 40.0000 mg | ORAL_TABLET | Freq: Every day | ORAL | 0 refills | Status: AC

## 2017-01-07 NOTE — Discharge Instructions (Signed)
As discussed, take the muscle relaxer only as needed at night time. Do not drink alcohol or drive when taking this medication. Apply heat or ice to the area and use the instruction provided for back exercises and modified exercises.   Follow up with a primary care provider.  Return if you experience new concerning symptoms in the meantime.

## 2017-01-07 NOTE — ED Notes (Signed)
ED Provider at bedside. 

## 2017-01-12 ENCOUNTER — Emergency Department (HOSPITAL_COMMUNITY)
Admission: EM | Admit: 2017-01-12 | Discharge: 2017-01-12 | Disposition: A | Discharge status: Home / Self Care | Payer: Self-pay | Attending: Emergency Medicine | Admitting: Emergency Medicine

## 2017-01-12 ENCOUNTER — Encounter (HOSPITAL_COMMUNITY): Payer: Self-pay | Admitting: Emergency Medicine

## 2017-01-12 ENCOUNTER — Emergency Department (HOSPITAL_COMMUNITY): Payer: Self-pay

## 2017-01-12 DIAGNOSIS — R059 Cough, unspecified: Secondary | ICD-10-CM

## 2017-01-12 DIAGNOSIS — Z79899 Other long term (current) drug therapy: Secondary | ICD-10-CM | POA: Insufficient documentation

## 2017-01-12 DIAGNOSIS — R05 Cough: Secondary | ICD-10-CM | POA: Insufficient documentation

## 2017-01-12 DIAGNOSIS — Z720 Tobacco use: Secondary | ICD-10-CM

## 2017-01-12 DIAGNOSIS — F17210 Nicotine dependence, cigarettes, uncomplicated: Secondary | ICD-10-CM | POA: Insufficient documentation

## 2017-01-12 NOTE — ED Triage Notes (Signed)
Pt reports productive cough for the past 4 day. Denies other URI symptoms.  Also reports his sciatica is acting up.

## 2017-01-12 NOTE — Discharge Instructions (Addendum)
Continue to stay well-hydrated. Continue to alternate between Tylenol and Ibuprofen for pain or fever. Use Mucinex for cough suppression/expectoration of mucus. Use netipot and flonase to help with nasal congestion. May consider over-the-counter Benadryl or other antihistamine to decrease secretions and for help with your symptoms. STOP SMOKING! [facility] in 5-7 days for recheck of ongoing symptoms and to establish medical care, and for ongoing management of your back pain. Use all the medications as they prescribed you at your last visit, taking them as directed for your back pain. Return to emergency department for emergent changing or worsening of symptoms.

## 2017-01-12 NOTE — ED Provider Notes (Signed)
[facility]-EMER[facility]Y DEPT Provider Note   CSN: [id number] Arrival date & time: 01/12/17  1450   By signing my name below, I, Freida Busmaniana Omoyeni, attest that this documentation has been prepared under the direction and in the presence of [id number] Dhanvi Boesen, [employer] CorporationPA-C. Electronically Signed: Freida Busmaniana Omoyeni, Scribe. 01/12/2017. 3:07 PM.   History   Chief Complaint Chief Complaint  Patient presents with  . Back Pain  . Cough    The history is provided by the patient and medical records. No language interpreter was used.  Cough  This is a new problem. The current episode started more than 2 days ago. The problem occurs constantly. The problem has not changed since onset.The cough is productive of sputum. There has been no fever. Pertinent negatives include no chest pain, no chills, no ear pain, no rhinorrhea, no sore throat, no myalgias, no shortness of breath and no wheezing. He has tried nothing for the symptoms. The treatment provided no relief. He is a smoker. His past medical history does not include pneumonia or asthma.     HPI Comments: Benjamin Lindsey is a 36 y.o. male with a history of anxiety and alcohol abuse, who presents to the Emergency Department complaining of a productive cough x 4-5 days. He describes yellowish sputum production and states cough is worse when smoking. No treatments tried, no alleviating factors noted. He denies any fevers, chills, ear pain/drainage, rhinorrhea, sore throat, CP, SOB, wheezing, abdominal pain, N/V/D/C, hematuria, dysuria, myalgias, arthralgias, numbness, tingling, focal weakness, or any other associated complaints at this time. He notes recent sick contacts; states his girlfriend was diagnosed with "walking PNA". No recent travel. +Smoker.  He is also complaining of lower back pain that radiates into his buttocks which is the same as his prior back pain from his last visit, no changes at this time. He was seen in the ED for this on 01/06/2017 (of note, that day he  stated he only had one beer, however his EtOH level was 249); he denied any saddle anesthesia/cauda equina symptoms, incontinence, or any other concerning symptoms; stated that he strained his back at work; he was discharged with prednisone and robaxin and advised to follow up with [facility] to establish PCP care; states he went to an Urgent care yesterday but they weren't able to help him. He has not been to see a PCP or followed up for his back complaint.  He is requesting a work note; states his job will not allow him to return without a work note, and he wants this today.    Past Medical History:  Diagnosis Date  . Alcohol abuse   . Anxiety   . Benzodiazepine dependence [facility](HCC)     Patient Active Problem List   Diagnosis Date Noted  . Alcohol dependence (HCC) 08/03/2013  . Benzodiazepine dependence (HCC) 08/03/2013  . Anxiety state, unspecified 08/03/2013    Past Surgical History:  Procedure Laterality Date  . ROTATOR CUFF REPAIR         Home Medications    Prior to Admission medications   Medication Sig Start Date End Date Taking? Authorizing Provider  amoxicillin-clavulanate (AUGMENTIN) 875-125 MG tablet Take 1 tablet by mouth every 12 (twelve) hours. 11/27/16   Horton, Benjamin Maskerourtney F, MD  clonazePAM (KLONOPIN) 0.5 MG tablet Take 0.5 mg by mouth 5 (five) times daily.    [provider]  cyclobenzaprine (FLEXERIL) 5 MG tablet Take 1 tablet (5 mg total) by mouth 2 (two) times daily as needed for  muscle spasms. 11/27/16   Horton, Benjamin Masker, MD  hydrOXYzine (ATARAX/VISTARIL) 25 MG tablet Take 1 tablet (25 mg total) by mouth every 6 (six) hours as needed for anxiety (or CIWA score </= 10). 08/06/13   Charm Rings, NP  methocarbamol (ROBAXIN) 500 MG tablet Take 1 tablet (500 mg total) by mouth 2 (two) times daily. 01/07/17   Mathews Robinsons B, PA-C  naproxen (NAPROSYN) 500 MG tablet Take 1 tablet (500 mg total) by mouth 2 (two) times daily. 11/27/16   Horton,  Benjamin Masker, MD  sertraline (ZOLOFT) 50 MG tablet Take 50 mg by mouth daily.    [provider]  traMADol (ULTRAM) 50 MG tablet Take 1 tablet (50 mg total) by mouth every 6 (six) hours as needed. 04/01/15   Hayden Rasmussen, NP  traZODone (DESYREL) 50 MG tablet Take 1 tablet (50 mg total) by mouth at bedtime as needed and may repeat dose one time if needed for sleep. 08/06/13   Charm Rings, NP    Family History History reviewed. No pertinent family history.  Social History Social History  Substance Use Topics  . Smoking status: Current Every Day Smoker    Packs/day: 1.00    Types: Cigarettes  . Smokeless tobacco: Never Used  . Alcohol use 0.0 oz/week    12 - 15 Cans of beer per week     Comment: heavily     Allergies   Patient has no known allergies.   Review of Systems Review of Systems  Constitutional: Negative for chills and fever.  HENT: Negative for ear discharge, ear pain, rhinorrhea and sore throat.   Respiratory: Positive for cough. Negative for shortness of breath and wheezing.   Cardiovascular: Negative for chest pain.  Gastrointestinal: Negative for abdominal pain, constipation, diarrhea, nausea and vomiting.  Genitourinary: Negative for dysuria and hematuria.  Musculoskeletal: Positive for back pain (chronic). Negative for arthralgias and myalgias.  Skin: Negative for color change.  Allergic/Immunologic: Negative for immunocompromised state.  Neurological: Negative for weakness and numbness.  Psychiatric/Behavioral: Negative for confusion.   All systems reviewed and are negative for acute change except as noted in the HPI.  Physical Exam Updated Vital Signs BP (!) 153/102 (BP Location: Right Arm)   Pulse (!) 107   Temp 98.4 F (36.9 C) (Oral)   Resp 16   SpO2 97%   Physical Exam  Constitutional: He is oriented to person, place, and time. Vital signs are normal. He appears well-developed and well-nourished.  Non-toxic appearance. No distress.    Afebrile, nontoxic, NAD, listening to music on his cell phone; smells slightly of alcohol  HENT:  Head: Normocephalic and atraumatic.  Mouth/Throat: Oropharynx is clear and moist and mucous membranes are normal.  Eyes: Conjunctivae and EOM are normal. Right eye exhibits no discharge. Left eye exhibits no discharge.  Neck: Normal range of motion. Neck supple.  Cardiovascular: Normal rate, regular rhythm, normal heart sounds and intact distal pulses.  Exam reveals no gallop and no friction rub.   No murmur heard. Tachycardic in triage but resolved on exam   Pulmonary/Chest: Effort normal and breath sounds normal. No respiratory distress. He has no decreased breath sounds. He has no wheezes. He has no rhonchi. He has no rales.  CTAB in all lung fields, no w/r/r, no hypoxia or increased WOB, speaking in full sentences, SpO2 97% on RA  Abdominal: Soft. Normal appearance and bowel sounds are normal. He exhibits no distension. There is no tenderness. There is no rigidity,  no rebound, no guarding, no CVA tenderness, no tenderness at McBurney's point and negative Murphy's sign.  Musculoskeletal: Normal range of motion.  MAE x4 Strength and sensation grossly intact in all extremities Distal pulses intact Gait steady  Neurological: He is alert and oriented to person, place, and time. He has normal strength. No sensory deficit.  Skin: Skin is warm, dry and intact. No rash noted.  Psychiatric: His affect is angry.  Intermittently angry but redirected easily   Nursing note and vitals reviewed.    ED Treatments / Results  DIAGNOSTIC STUDIES:  Oxygen Saturation is 97% on RA, normal by my interpretation.    COORDINATION OF CARE:  3:15 PM Discussed treatment plan with pt at bedside. Pt growing increasingly aggravated when told he would need to f/up with a PCP provider for ongoing management of his back pain and for any return to work notes.   Labs (all labs ordered are listed, but only abnormal  results are displayed) Labs Reviewed - No data to display  EKG  EKG Interpretation None       Radiology Dg Chest 2 View  Result Date: 01/12/2017 CLINICAL DATA:  Cough for 3-4 days. EXAM: CHEST  2 VIEW COMPARISON:  None. FINDINGS: The heart size and mediastinal contours are within normal limits. Both lungs are clear. The visualized skeletal structures are unremarkable. IMPRESSION: Negative chest. Electronically Signed   By: Drusilla Kanner M.D.   On: 01/12/2017 15:40    Procedures Procedures (including critical care time)  Medications Ordered in ED Medications - No data to display   Initial Impression / Assessment and Plan / ED Course  I have reviewed the triage vital signs and the nursing notes.  Pertinent labs & imaging results that were available during my care of the patient were reviewed by me and considered in my medical decision making (see chart for details).     36 y.o. male here with c/o cough x4-5 days, +sick contacts at home. No other symptoms. On exam, clear lung sounds. Smells somewhat of alcohol. Will obtain CXR to ensure no PNA, although doubtful. During evaluation he also states that he wants a return to work note for his back injury; he was seen for his back on 01/06/17 and evaluated at that time (of note, EtOH level 249 that visit), discharged with prednisone and robaxin and told to f/up with [facility] to establish care; he has not yet established care or followed up. He has no red flag s/sx for back pain, no s/sx of cord compression, ambulatory with steady gait; doubt need for further emergent work up. Advised that he needs to f/up with a PCP for ongoing management of his back pain and for any medical clearance notes. He became upset and initially threatened to leave because he wasn't getting a work note, but then later agreed to stay for CXR. Will reassess after this  3:46 PM CXR negative. Likely viral etiology. Advised smoking cessation. OTC remedies advised for  symptomatic relief. F/up with [facility] in 1wk for recheck of symptoms and to establish medical care, and for ongoing management of his chronic back pain. I explained the diagnosis and have given explicit precautions to return to the ER including for any other new or worsening symptoms. The patient understands and accepts the medical plan as it's been dictated and I have answered their questions. Discharge instructions concerning home care and prescriptions have been given. The patient is STABLE and is discharged to home in good condition.   I  personally performed the services described in this documentation, which was scribed in my presence. The recorded information has been reviewed and is accurate.   Final Clinical Impressions(s) / ED Diagnoses   Final diagnoses:  Cough  Tobacco user    New Prescriptions New Prescriptions   No medications on [address], [place], [place] 01/12/17 1546    Linwood Dibbles, MD 01/13/17 1202

## 2017-01-12 NOTE — ED Notes (Signed)
PT STS HE IS HAVING A PANIC ATTACK, AND WANTS TO GO TO HIS CAR TO TAKE HIS OWN MEDICATION. PT ADVISED NOT TO LEAVE THE TREATMENT AREA. PA MERCEDES MADE AWARE.

## 2017-01-12 NOTE — ED Notes (Signed)
PT DISCHARGED. INSTRUCTIONS GIVEN. AAOX4. PT IN NO APPARENT DISTRESS WITH MILD PAIN. THE OPPORTUNITY TO ASK QUESTIONS WAS PROVIDED. 

## 2017-01-13 ENCOUNTER — Telehealth (HOSPITAL_BASED_OUTPATIENT_CLINIC_OR_DEPARTMENT_OTHER): Payer: Self-pay | Admitting: *Deleted

## 2017-01-13 NOTE — Telephone Encounter (Signed)
Pt presented to ED today requesting note to return to work. He was seen here last week by Lynne LeaderEDPa Mitchell who was on duty today. Chart reviewed by her and she provided return to work note. Pt had left ED lobby and note was given to registration to give to pt when he returned.

## 2017-02-15 ENCOUNTER — Emergency Department (HOSPITAL_COMMUNITY)
Admission: EM | Admit: 2017-02-15 | Discharge: 2017-02-15 | Disposition: A | Discharge status: Home / Self Care | Payer: Self-pay | Attending: Emergency Medicine | Admitting: Emergency Medicine

## 2017-02-15 ENCOUNTER — Encounter (HOSPITAL_COMMUNITY): Payer: Self-pay

## 2017-02-15 ENCOUNTER — Emergency Department (HOSPITAL_COMMUNITY): Payer: Self-pay

## 2017-02-15 DIAGNOSIS — F13239 Sedative, hypnotic or anxiolytic dependence with withdrawal, unspecified: Secondary | ICD-10-CM | POA: Insufficient documentation

## 2017-02-15 DIAGNOSIS — S02401B Maxillary fracture, unspecified, initial encounter for open fracture: Secondary | ICD-10-CM | POA: Insufficient documentation

## 2017-02-15 DIAGNOSIS — Z79899 Other long term (current) drug therapy: Secondary | ICD-10-CM | POA: Insufficient documentation

## 2017-02-15 DIAGNOSIS — R569 Unspecified convulsions: Secondary | ICD-10-CM

## 2017-02-15 DIAGNOSIS — Y999 Unspecified external cause status: Secondary | ICD-10-CM | POA: Insufficient documentation

## 2017-02-15 DIAGNOSIS — S025XXB Fracture of tooth (traumatic), initial encounter for open fracture: Secondary | ICD-10-CM | POA: Insufficient documentation

## 2017-02-15 DIAGNOSIS — Y9289 Other specified places as the place of occurrence of the external cause: Secondary | ICD-10-CM | POA: Insufficient documentation

## 2017-02-15 DIAGNOSIS — F13939 Sedative, hypnotic or anxiolytic use, unspecified with withdrawal, unspecified: Secondary | ICD-10-CM

## 2017-02-15 DIAGNOSIS — F17210 Nicotine dependence, cigarettes, uncomplicated: Secondary | ICD-10-CM | POA: Insufficient documentation

## 2017-02-15 DIAGNOSIS — W0110XA Fall on same level from slipping, tripping and stumbling with subsequent striking against unspecified object, initial encounter: Secondary | ICD-10-CM | POA: Insufficient documentation

## 2017-02-15 DIAGNOSIS — Y939 Activity, unspecified: Secondary | ICD-10-CM | POA: Insufficient documentation

## 2017-02-15 LAB — BASIC METABOLIC PANEL
Anion gap: 7 (ref 5–15)
BUN: 13 mg/dL (ref 6–20)
CHLORIDE: 109 mmol/L (ref 101–111)
CO2: 25 mmol/L (ref 22–32)
CREATININE: 0.86 mg/dL (ref 0.61–1.24)
Calcium: 8.7 mg/dL — ABNORMAL LOW (ref 8.9–10.3)
GFR calc Af Amer: >60 mL/min (ref >60)
GFR calc non Af Amer: >60 mL/min (ref >60)
GLUCOSE: 110 mg/dL — AB (ref 65–99)
Potassium: 4 mmol/L (ref 3.5–5.1)
Sodium: 141 mmol/L (ref 135–145)

## 2017-02-15 LAB — CBC WITH DIFFERENTIAL/PLATELET
Basophils Absolute: 0 10*3/uL (ref 0.0–0.1)
Basophils Relative: 1 %
EOS PCT: 4 %
Eosinophils Absolute: 0.3 10*3/uL (ref 0.0–0.7)
HCT: 40.6 % (ref 39.0–52.0)
Hemoglobin: 13.3 g/dL (ref 13.0–17.0)
LYMPHS ABS: 1.1 10*3/uL (ref 0.7–4.0)
LYMPHS PCT: 13 %
MCH: 27.5 pg (ref 26.0–34.0)
MCHC: 32.8 g/dL (ref 30.0–36.0)
MCV: 83.9 fL (ref 78.0–100.0)
MONO ABS: 0.7 10*3/uL (ref 0.1–1.0)
Monocytes Relative: 9 %
Neutro Abs: 6 10*3/uL (ref 1.7–7.7)
Neutrophils Relative %: 73 %
PLATELETS: 212 10*3/uL (ref 150–400)
RBC: 4.84 MIL/uL (ref 4.22–5.81)
RDW: 16.1 % — AB (ref 11.5–15.5)
WBC: 8.1 10*3/uL (ref 4.0–10.5)

## 2017-02-15 MED ORDER — OXYCODONE-ACETAMINOPHEN 5-325 MG PO TABS
2.0000 | ORAL_TABLET | Freq: Once | ORAL | Status: AC
  Administered 2017-02-15: 2 via ORAL
  Filled 2017-02-15: qty 2

## 2017-02-15 MED ORDER — LORAZEPAM 1 MG PO TABS
2.0000 mg | ORAL_TABLET | Freq: Once | ORAL | Status: AC
  Administered 2017-02-15: 2 mg via ORAL
  Filled 2017-02-15: qty 2

## 2017-02-15 MED ORDER — SODIUM CHLORIDE 0.9 % IV BOLUS (SEPSIS)
1000.0000 mL | Freq: Once | INTRAVENOUS | Status: AC
  Administered 2017-02-15: 1000 mL via INTRAVENOUS

## 2017-02-15 MED ORDER — PENICILLIN V POTASSIUM 500 MG PO TABS
500.0000 mg | ORAL_TABLET | Freq: Once | ORAL | Status: AC
  Administered 2017-02-15: 500 mg via ORAL
  Filled 2017-02-15: qty 1

## 2017-02-15 MED ORDER — CHLORDIAZEPOXIDE HCL 25 MG PO CAPS: ORAL_CAPSULE | ORAL | 0 refills | Status: DC

## 2017-02-15 MED ORDER — HYDROMORPHONE HCL 1 MG/ML IJ SOLN
1.0000 mg | Freq: Once | INTRAMUSCULAR | Status: AC
  Administered 2017-02-15: 1 mg via INTRAVENOUS
  Filled 2017-02-15: qty 1

## 2017-02-15 MED ORDER — OXYCODONE-ACETAMINOPHEN 5-325 MG PO TABS: 2.0000 | ORAL_TABLET | ORAL | 0 refills | Status: DC | PRN

## 2017-02-15 MED ORDER — MORPHINE SULFATE (PF) 4 MG/ML IV SOLN
4.0000 mg | Freq: Once | INTRAVENOUS | Status: AC
  Administered 2017-02-15: 4 mg via INTRAVENOUS
  Filled 2017-02-15: qty 1

## 2017-02-15 MED ORDER — PENICILLIN V POTASSIUM 500 MG PO TABS: 500.0000 mg | ORAL_TABLET | Freq: Four times a day (QID) | ORAL | 0 refills | Status: DC

## 2017-02-15 MED ORDER — ONDANSETRON HCL 4 MG/2ML IJ SOLN
4.0000 mg | Freq: Once | INTRAMUSCULAR | Status: AC
  Administered 2017-02-15: 4 mg via INTRAVENOUS
  Filled 2017-02-15: qty 2

## 2017-02-15 NOTE — ED Triage Notes (Signed)
[facility] with complaints of severe tooth pain post fall from two seizures reported lasting a total of 15 minutes. [facility], decided not to stay and seizure began in one of the rooms. Reports being clean from benzos and alcohol since 02/09/17.

## 2017-02-15 NOTE — ED Notes (Signed)
Bed: EA54WA05 Expected date:  Expected time:  Means of arrival:  Comments: EMS Seizure, detox

## 2017-02-15 NOTE — ED Provider Notes (Signed)
[facility]-EMERGENCY DEPT Provider Note   CSN: [id number] Arrival date & time: 02/15/17  1338     History   Chief Complaint Chief Complaint  Patient presents with  . Seizures  . Dental Pain    HPI Benjamin Lindsey is a 36 y.o. male.  HPI 36 year old Caucasian male history of alcohol abuse, this is a dependence, anxiety presents to the ED today with complaints of 2 seizures and dental fractures. The patient states that he has a history of alcohol abuse and benzos dependency. States that he went to detox 8 days ago where he "went cold [place]" [facility] [employer] and alcohol. States that he was released and today [facility]. [facility] he had 2 apparent generalized seizures and hit his chin and teeth on the ground. Fractured his teeth. Patient states that he has not had alcohol or Klonopin for 8 days now. No history of seizures. Has not taken anything for his symptoms prior to arrival. Nothing makes better or worse. He denies any incontinence.  Pt denies any fever, chill, ha, vision changes, lightheadedness, dizziness, congestion, neck pain, cp, sob, cough, abd pain, n/v/d, urinary symptoms, change in bowel habits, melena, hematochezia, lower extremity paresthesias. Tdap utd.  Past Medical History:  Diagnosis Date  . Alcohol abuse   . Anxiety   . Benzodiazepine dependence [facility](HCC)     Patient Active Problem List   Diagnosis Date Noted  . Alcohol dependence (HCC) 08/03/2013  . Benzodiazepine dependence (HCC) 08/03/2013  . Anxiety state, unspecified 08/03/2013    Past Surgical History:  Procedure Laterality Date  . ROTATOR CUFF REPAIR         Home Medications    Prior to Admission medications   Medication Sig Start Date End Date Taking? Authorizing Provider  gabapentin (NEURONTIN) 300 MG capsule Take 300 mg by mouth 3 (three) times daily. 02/12/17 03/14/17 Yes [provider]  hydrOXYzine (ATARAX/VISTARIL) 25 MG tablet Take 1 tablet (25 mg total)  by mouth every 6 (six) hours as needed for anxiety (or CIWA score </= 10). 08/06/13  Yes Lord, Herminio HeadsJamison Y, NP  sertraline (ZOLOFT) 50 MG tablet Take 100 mg by mouth daily.    Yes [provider]  amoxicillin-clavulanate (AUGMENTIN) 875-125 MG tablet Take 1 tablet by mouth every 12 (twelve) hours. Patient not taking: Reported on 02/15/2017 11/27/16   Horton, Benjamin Maskerourtney F, MD  chlordiazePOXIDE (LIBRIUM) 25 MG capsule 50mg  PO TID x 1D, then 25-50mg  PO BID X 1D, then 25-50mg  PO QD X 1D 02/15/17   Rolland PorterJames, Mark, MD  clonazePAM (KLONOPIN) 0.5 MG tablet Take 0.5 mg by mouth 5 (five) times daily.    [provider]  cyclobenzaprine (FLEXERIL) 5 MG tablet Take 1 tablet (5 mg total) by mouth 2 (two) times daily as needed for muscle spasms. Patient not taking: Reported on 02/15/2017 11/27/16   Horton, Benjamin Maskerourtney F, MD  methocarbamol (ROBAXIN) 500 MG tablet Take 1 tablet (500 mg total) by mouth 2 (two) times daily. Patient not taking: Reported on 02/15/2017 01/07/17   Mathews RobinsonsMitchell, Jessica B, PA-C  naproxen (NAPROSYN) 500 MG tablet Take 1 tablet (500 mg total) by mouth 2 (two) times daily. Patient not taking: Reported on 02/15/2017 11/27/16   Horton, Benjamin Maskerourtney F, MD  oxyCODONE-acetaminophen (PERCOCET/ROXICET) 5-325 MG tablet Take 2 tablets by mouth every 4 (four) hours as needed. 02/15/17   Rolland PorterJames, Mark, MD  penicillin v potassium (VEETID) 500 MG tablet Take 1 tablet (500 mg total) by mouth 4 (four)  times daily. 02/15/17   Rolland Porter, MD  traMADol (ULTRAM) 50 MG tablet Take 1 tablet (50 mg total) by mouth every 6 (six) hours as needed. Patient not taking: Reported on 02/15/2017 04/01/15   Hayden Rasmussen, NP  traZODone (DESYREL) 50 MG tablet Take 1 tablet (50 mg total) by mouth at bedtime as needed and may repeat dose one time if needed for sleep. Patient not taking: Reported on 02/15/2017 08/06/13   Charm Rings, NP    Family History History reviewed. No pertinent family history.  Social History Social History  Substance  Use Topics  . Smoking status: Current Every Day Smoker    Packs/day: 1.00    Types: Cigarettes  . Smokeless tobacco: Never Used  . Alcohol use 0.0 oz/week    12 - 15 Cans of beer per week     Comment: heavily     Allergies   Patient has no known allergies.   Review of Systems Review of Systems  Constitutional: Negative for chills and fever.  HENT: Positive for dental problem. Negative for congestion and sore throat.   Eyes: Negative for visual disturbance.  Respiratory: Negative for cough and shortness of breath.   Cardiovascular: Negative for chest pain.  Gastrointestinal: Negative for abdominal pain, diarrhea, nausea and vomiting.  Genitourinary: Negative for dysuria, flank pain, frequency, hematuria, scrotal swelling, testicular pain and urgency.  Musculoskeletal: Negative for arthralgias and myalgias.  Skin: Positive for wound. Negative for rash.  Neurological: Positive for seizures. Negative for dizziness, syncope, weakness, light-headedness, numbness and headaches.  Psychiatric/Behavioral: Negative for sleep disturbance. The patient is not nervous/anxious.      Physical Exam Updated Vital Signs BP (!) 124/103   Pulse 72   Resp 19   SpO2 97%   Physical Exam  Constitutional: He is oriented to person, place, and time. He appears well-developed and well-nourished.  Non-toxic appearance. No distress.  HENT:  Head: Normocephalic and atraumatic.  Mouth/Throat: Uvula is midline, oropharynx is clear and moist and mucous membranes are normal.    Dental fractures with partial dentition intact. Do not appreciate any exposed dentition. Bleeding is controlled. No obvious laceration to the lip oral mucosa.  Eyes: Pupils are equal, round, and reactive to light. Conjunctivae are normal. Right eye exhibits no discharge. Left eye exhibits no discharge.  Neck: Normal range of motion. Neck supple.  No c spine midline tenderness. No paraspinal tenderness. No deformities or step offs  noted. Full ROM. Supple. No nuchal rigidity.    Cardiovascular: Normal rate, regular rhythm, normal heart sounds and intact distal pulses.  Exam reveals no gallop and no friction rub.   No murmur heard. Pulmonary/Chest: Effort normal and breath sounds normal. No respiratory distress. He has no wheezes. He has no rales. He exhibits no tenderness.  Abdominal: Soft. Bowel sounds are normal. He exhibits no distension. There is no tenderness. There is no rebound and no guarding.  Musculoskeletal: Normal range of motion. He exhibits no tenderness.  No midline T spine or L spine tenderness. No deformities or step offs noted. Full ROM. Pelvis is stable.  Lymphadenopathy:    He has no cervical adenopathy.  Neurological: He is alert and oriented to person, place, and time.  The patient is alert, attentive, and oriented x 3. Speech is clear. Cranial nerve II-VII grossly intact. Negative pronator drift. Sensation intact. Strength 5/5 in all extremities. Reflexes 2+ and symmetric at biceps, triceps, knees, and ankles. Rapid alternating movement and fine finger movements intact. Romberg is absent.  Posture and gait normal.   Skin: Skin is warm and dry. Capillary refill takes less than 2 seconds. No rash noted.  Psychiatric: His behavior is normal. Judgment and thought content normal.  Nursing note and vitals reviewed.    ED Treatments / Results  Labs (all labs ordered are listed, but only abnormal results are displayed) Labs Reviewed  BASIC METABOLIC PANEL - Abnormal; Notable for the following:       Result Value   Glucose, Bld 110 (*)    Calcium 8.7 (*)    All other components within normal limits  CBC WITH DIFFERENTIAL/PLATELET - Abnormal; Notable for the following:    RDW 16.1 (*)    All other components within normal limits  URINALYSIS, ROUTINE W REFLEX MICROSCOPIC  RAPID URINE DRUG SCREEN, HOSP PERFORMED    EKG  EKG Interpretation None       Radiology Ct Head Wo Contrast  Result  Date: 02/15/2017 CLINICAL DATA:  Seizure, fall and tooth pain. EXAM: CT HEAD WITHOUT CONTRAST CT MAXILLOFACIAL WITHOUT CONTRAST CT CERVICAL SPINE WITHOUT CONTRAST TECHNIQUE: Multidetector CT imaging of the head, cervical spine, and maxillofacial structures were performed using the standard protocol without intravenous contrast. Multiplanar CT image reconstructions of the cervical spine and maxillofacial structures were also generated. COMPARISON:  Prior CT of the head on 10/14/2003 FINDINGS: CT HEAD FINDINGS Brain: No evidence of acute infarction, hemorrhage, hydrocephalus, extra-axial collection or mass lesion/mass effect. Vascular: No hyperdense vessel or unexpected calcification. Skull: Normal. Negative for fracture or focal lesion. Other: None. CT MAXILLOFACIAL FINDINGS Osseous: Acute fracture of the maxilla is noted in the midline at the base of both upper central incisors. There is visible superior displacement of tooth 8 with fracture surrounding the apical portion of this tooth and also fracture planes surrounding the apical portion of tooth 9. Fracture plane may extend overt towards the apical portion of tooth 10. The T thumb cells appear grossly intact. The temporomandibular joint show normal alignment. Orbits: Normal and symmetric appearance of the orbits without evidence of fracture or intraorbital hemorrhage. The globes and extraocular muscles appear normal. Sinuses: The paranasal sinuses and mastoid air cells are normally aerated. Soft tissues: No focal soft tissue abnormalities CT CERVICAL SPINE FINDINGS Alignment: Normal. Skull base and vertebrae: No acute fracture. No primary bone lesion or focal pathologic process. Soft tissues and spinal canal: No prevertebral fluid or swelling. No visible canal hematoma. Disc levels: Normal disc space heights throughout the cervical spine. Upper chest: Negative. Other: No incidental masses or enlarged lymph nodes. The thyroid gland appears unremarkable.  IMPRESSION: 1. Normal head CT. 2. Acute fracture of the midline maxilla at the base of both upper central incisors with superior displacement of tooth 8. Fracture planes extend to the apical portion of tooth 9 and may extend towards the apical portion of tooth 10. 3. Normal cervical spine CT. Electronically Signed   By: Irish Lack M.D.   On: 02/15/2017 16:24   Ct Cervical Spine Wo Contrast  Result Date: 02/15/2017 CLINICAL DATA:  Seizure, fall and tooth pain. EXAM: CT HEAD WITHOUT CONTRAST CT MAXILLOFACIAL WITHOUT CONTRAST CT CERVICAL SPINE WITHOUT CONTRAST TECHNIQUE: Multidetector CT imaging of the head, cervical spine, and maxillofacial structures were performed using the standard protocol without intravenous contrast. Multiplanar CT image reconstructions of the cervical spine and maxillofacial structures were also generated. COMPARISON:  Prior CT of the head on 10/14/2003 FINDINGS: CT HEAD FINDINGS Brain: No evidence of acute infarction, hemorrhage, hydrocephalus, extra-axial collection or mass lesion/mass effect.  Vascular: No hyperdense vessel or unexpected calcification. Skull: Normal. Negative for fracture or focal lesion. Other: None. CT MAXILLOFACIAL FINDINGS Osseous: Acute fracture of the maxilla is noted in the midline at the base of both upper central incisors. There is visible superior displacement of tooth 8 with fracture surrounding the apical portion of this tooth and also fracture planes surrounding the apical portion of tooth 9. Fracture plane may extend overt towards the apical portion of tooth 10. The T thumb cells appear grossly intact. The temporomandibular joint show normal alignment. Orbits: Normal and symmetric appearance of the orbits without evidence of fracture or intraorbital hemorrhage. The globes and extraocular muscles appear normal. Sinuses: The paranasal sinuses and mastoid air cells are normally aerated. Soft tissues: No focal soft tissue abnormalities CT CERVICAL SPINE  FINDINGS Alignment: Normal. Skull base and vertebrae: No acute fracture. No primary bone lesion or focal pathologic process. Soft tissues and spinal canal: No prevertebral fluid or swelling. No visible canal hematoma. Disc levels: Normal disc space heights throughout the cervical spine. Upper chest: Negative. Other: No incidental masses or enlarged lymph nodes. The thyroid gland appears unremarkable. IMPRESSION: 1. Normal head CT. 2. Acute fracture of the midline maxilla at the base of both upper central incisors with superior displacement of tooth 8. Fracture planes extend to the apical portion of tooth 9 and may extend towards the apical portion of tooth 10. 3. Normal cervical spine CT. Electronically Signed   By: Irish Lack M.D.   On: 02/15/2017 16:24   Ct Maxillofacial Wo Cm  Result Date: 02/15/2017 CLINICAL DATA:  Seizure, fall and tooth pain. EXAM: CT HEAD WITHOUT CONTRAST CT MAXILLOFACIAL WITHOUT CONTRAST CT CERVICAL SPINE WITHOUT CONTRAST TECHNIQUE: Multidetector CT imaging of the head, cervical spine, and maxillofacial structures were performed using the standard protocol without intravenous contrast. Multiplanar CT image reconstructions of the cervical spine and maxillofacial structures were also generated. COMPARISON:  Prior CT of the head on 10/14/2003 FINDINGS: CT HEAD FINDINGS Brain: No evidence of acute infarction, hemorrhage, hydrocephalus, extra-axial collection or mass lesion/mass effect. Vascular: No hyperdense vessel or unexpected calcification. Skull: Normal. Negative for fracture or focal lesion. Other: None. CT MAXILLOFACIAL FINDINGS Osseous: Acute fracture of the maxilla is noted in the midline at the base of both upper central incisors. There is visible superior displacement of tooth 8 with fracture surrounding the apical portion of this tooth and also fracture planes surrounding the apical portion of tooth 9. Fracture plane may extend overt towards the apical portion of tooth 10.  The T thumb cells appear grossly intact. The temporomandibular joint show normal alignment. Orbits: Normal and symmetric appearance of the orbits without evidence of fracture or intraorbital hemorrhage. The globes and extraocular muscles appear normal. Sinuses: The paranasal sinuses and mastoid air cells are normally aerated. Soft tissues: No focal soft tissue abnormalities CT CERVICAL SPINE FINDINGS Alignment: Normal. Skull base and vertebrae: No acute fracture. No primary bone lesion or focal pathologic process. Soft tissues and spinal canal: No prevertebral fluid or swelling. No visible canal hematoma. Disc levels: Normal disc space heights throughout the cervical spine. Upper chest: Negative. Other: No incidental masses or enlarged lymph nodes. The thyroid gland appears unremarkable. IMPRESSION: 1. Normal head CT. 2. Acute fracture of the midline maxilla at the base of both upper central incisors with superior displacement of tooth 8. Fracture planes extend to the apical portion of tooth 9 and may extend towards the apical portion of tooth 10. 3. Normal cervical spine CT. Electronically Signed  By: Irish Lack M.D.   On: 02/15/2017 16:24    Procedures Procedures (including critical care time)  Medications Ordered in ED Medications  oxyCODONE-acetaminophen (PERCOCET/ROXICET) 5-325 MG per tablet 2 tablet (not administered)  LORazepam (ATIVAN) tablet 2 mg (not administered)  penicillin v potassium (VEETID) tablet 500 mg (not administered)  sodium chloride 0.9 % bolus 1,000 mL (0 mLs Intravenous Stopped 02/15/17 1727)  morphine 4 MG/ML injection 4 mg (4 mg Intravenous Given 02/15/17 1525)  ondansetron (ZOFRAN) injection 4 mg (4 mg Intravenous Given 02/15/17 1525)  HYDROmorphone (DILAUDID) injection 1 mg (1 mg Intravenous Given 02/15/17 1654)     Initial Impression / Assessment and Plan / ED Course  I have reviewed the triage vital signs and the nursing notes.  Pertinent labs & imaging results that  were available during my care of the patient were reviewed by me and considered in my medical decision making (see chart for details).     Patient presents to the ED after 2 apparent seizures likely due to withdrawal after detoxing from alcohol and benzos. Patient was postictal into inpatient rehabilitation facility today. Physical exam is notable for 3 dental fractures of the upper incisor. Patient with no focal neuro deficits. Lab work is reassuring. Vital signs remained stable. CT scan was ordered that showed no acute cranial abnormalities or cervical spine 100 hours. Does note acute fracture of the midline maxilla at the base of both upper central incisors with superior displacement of tooth 8. Fracture planes extend to the apical portion of tooth 9 and may extend towards the apical portion of tooth 10.  Patient needs oral surgery follow-up however we do not have an oral surgeon on call for [place] today. Patient does have a dentist that he can see tomorrow we will then refer him to an oral surgeon. Given that this is an open fracture will start him on penicillin. The patient still in the window for withdrawal seizures. We'll give him a dose of Ativan and sent him home with Librium taper. Pain medicine for fractures. Patient remains hemodynamically stable. He is to watch several hours in the ED has had no additional seizure-like activity. Family felt safe for discharge home with close follow-up with PCP a dentist.  Pt is hemodynamically stable, in NAD, & able to ambulate in the ED. Evaluation does not show pathology that would require ongoing emergent intervention or inpatient treatment. I explained the diagnosis to the patient. Pain has been managed & has no complaints prior to dc. Pt is comfortable with above plan and is stable for discharge at this time. All questions were answered prior to disposition. Strict return precautions for f/u to the ED were discussed. Pt seen and examined by Dr. Fayrene Fearing who is  agreeable to the above plan.    Final Clinical Impressions(s) / ED Diagnoses   Final diagnoses:  Open fracture of maxilla, unspecified laterality, initial encounter (HCC)  Open fracture of tooth, initial encounter  Seizure (HCC)  Benzodiazepine withdrawal with complication (HCC)    New Prescriptions New Prescriptions   CHLORDIAZEPOXIDE (LIBRIUM) 25 MG CAPSULE    50mg  PO TID x 1D, then 25-50mg  PO BID X 1D, then 25-50mg  PO QD X 1D   OXYCODONE-ACETAMINOPHEN (PERCOCET/ROXICET) 5-325 MG TABLET    Take 2 tablets by mouth every 4 (four) hours as needed.   PENICILLIN V POTASSIUM (VEETID) 500 MG TABLET    Take 1 tablet (500 mg total) by mouth 4 (four) times daily.     Demetrios Loll  T, PA-C 02/15/17 1757    Rise Mu, PA-C 02/15/17 1844    Rolland Porter, MD 02/17/17 1040

## 2017-02-15 NOTE — ED Provider Notes (Signed)
Patient seen and evaluated. Discussed with PA. Patient had withdrawal seizure today after 8 days "cold [place]" from Klonopin. Has planned to go to inpatient rehabilitation. Has alveolar ridge fractures and dental fractures. No additional facial fractures noted. He is comfortable following up with his dentist. We do not have oral surgery to refer him to tonight. Do not think this is necessary. On exam the fractured teeth are stable within the maxilla. Plan will be tapering dose Librium to prevent additional seizures. Oxycodone for pain. Told in no uncertain terms not to drive. Return if additional seizures. Penicillin for open fracture prophylaxis.   Rolland PorterJames, Aaleigha Bozza, MD 02/15/17 [phone]

## 2017-02-15 NOTE — Discharge Instructions (Signed)
Follow up with your dentist. Prescriptions as written. Do not take your medications more often then scheduled No driving

## 2017-03-02 ENCOUNTER — Emergency Department (HOSPITAL_BASED_OUTPATIENT_CLINIC_OR_DEPARTMENT_OTHER)
Admission: EM | Admit: 2017-03-02 | Discharge: 2017-03-02 | Disposition: A | Discharge status: Home / Self Care | Payer: Self-pay | Attending: Emergency Medicine | Admitting: Emergency Medicine

## 2017-03-02 ENCOUNTER — Encounter (HOSPITAL_BASED_OUTPATIENT_CLINIC_OR_DEPARTMENT_OTHER): Payer: Self-pay

## 2017-03-02 DIAGNOSIS — Z0189 Encounter for other specified special examinations: Secondary | ICD-10-CM | POA: Insufficient documentation

## 2017-03-02 DIAGNOSIS — F17210 Nicotine dependence, cigarettes, uncomplicated: Secondary | ICD-10-CM | POA: Insufficient documentation

## 2017-03-02 DIAGNOSIS — Z7689 Persons encountering health services in other specified circumstances: Secondary | ICD-10-CM

## 2017-03-02 DIAGNOSIS — Z79899 Other long term (current) drug therapy: Secondary | ICD-10-CM | POA: Insufficient documentation

## 2017-03-02 DIAGNOSIS — M545 Low back pain: Secondary | ICD-10-CM | POA: Insufficient documentation

## 2017-03-02 NOTE — ED Triage Notes (Signed)
Benjamin Lindsey, Consulting civil engineer spoke with pt prior to checking in-states pt requested RTW note for back pain that he was seen here for in June-pt agrees-NAD-steady gait

## 2017-03-02 NOTE — ED Provider Notes (Signed)
[facility] Provider Note   CSN: [id number] Arrival date & time: 03/02/17  1247     History   Chief Complaint Chief Complaint  Patient presents with  . Letter for School/Work    HPI Benjamin Lindsey is a 36 y.o. male who presents to the emergency department with a chief complaint of needing a note to return to work. He reports that he was seen and evaluated in the ED for low back pain in June and was able to return to work for like a week, but for the pain worsened. He reports that the pain has since improved, but his employer will not allow him to return to work until he has a note from the same medical facility that initially evaluated him. He denies back pain, urinary or fecal incontinence, numbness, or weakness at this time. No new falls or injuries. No treatment prior to arrival.  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  The history is provided by the patient. No language interpreter was used.    Past Medical History:  Diagnosis Date  . Alcohol abuse   . Anxiety   . Benzodiazepine dependence [facility])     Patient Active Problem List   Diagnosis Date Noted  . Alcohol dependence (HCC) 08/03/2013  . Benzodiazepine dependence (HCC) 08/03/2013  . Anxiety state, unspecified 08/03/2013    Past Surgical History:  Procedure Laterality Date  . ROTATOR CUFF REPAIR         Home Medications    Prior to Admission medications   Medication Sig Start Date End Date Taking? Authorizing Provider  gabapentin (NEURONTIN) 300 MG capsule Take 300 mg by mouth 3 (three) times daily. 02/12/17 03/14/17  [provider]  hydrOXYzine (ATARAX/VISTARIL) 25 MG tablet Take 1 tablet (25 mg total) by mouth every 6 (six) hours as needed for anxiety (or CIWA score </= 10). 08/06/13   Charm Rings, NP  methocarbamol (ROBAXIN) 500 MG tablet Take 1 tablet (500 mg total) by mouth 2 (two) times daily. Patient not  taking: Reported on 02/15/2017 01/07/17   Mathews Robinsons B, PA-C  sertraline (ZOLOFT) 50 MG tablet Take 100 mg by mouth daily.     [provider]    Family History No family history on file.  Social History Social History  Substance Use Topics  . Smoking status: Current Every Day Smoker    Packs/day: 1.00    Types: Cigarettes  . Smokeless tobacco: Never Used  . Alcohol use No     Comment: quit 02/09/17     Allergies   Patient has no known allergies.   Review of Systems Review of Systems  Constitutional: Negative for activity change.  Respiratory: Negative for shortness of breath.   Cardiovascular: Negative for chest pain.  Gastrointestinal: Negative for abdominal pain.  Genitourinary: Negative for dysuria and frequency.  Musculoskeletal: Negative for arthralgias, back pain and gait problem.  Skin: Negative for rash.     Physical Exam Updated Vital Signs BP (!) 133/94 (BP Location: Left Arm)   Pulse 93   Temp 99 F (37.2 C) (Oral)   Resp 18   Ht 5\' 11"  (1.803 m)   Wt 99.8 kg (220 lb)   SpO2 98%   BMI 30.68 kg/m   Physical Exam  Constitutional: He appears well-developed.  HENT:  Head: Normocephalic.  Eyes: Conjunctivae are normal.  Neck: Neck supple.  Cardiovascular: Normal rate and regular  rhythm.   No murmur heard. Pulmonary/Chest: Effort normal.  Abdominal: Soft. He exhibits no distension.  Musculoskeletal:  No TTP to the spinous processes of the cervical, thoracic, or lumbar spine or surrounding paraspinal muscles. Patient is able to ambulate without difficulty. Full range of motion of the back, and the patient is able to bend over and touch his toes. 2+ DP and PT pulses. 5/5 strength of the bilateral lower extremities. No sensory deficits.    Neurological: He is alert.  Skin: Skin is warm and dry.  Psychiatric: His behavior is normal.  Nursing note and vitals reviewed.    ED Treatments / Results  Labs (all labs ordered are listed, but  only abnormal results are displayed) Labs Reviewed - No data to display  EKG  EKG Interpretation None       Radiology No results found.  Procedures Procedures (including critical care time)  Medications Ordered in ED Medications - No data to display   Initial Impression / Assessment and Plan / ED Course  I have reviewed the triage vital signs and the nursing notes.  Pertinent labs & imaging results that were available during my care of the patient were reviewed by me and considered in my medical decision making (see chart for details).     36 year old male presenting for a return to work evaluation. He reports that he is evaluated in the emergency department in June for low back pain and initially was able to return to work for one week; however, the pain worsens and he has been out of work since. Unremarkable back exam. Neurovascularly intact. The patient has no other complaints at this time. Vital signs stable. No acute distress. Will provide the patient with a work note that says that he was evaluated and is able to return to work as of today's date. The patient is safe for discharge at this time.  Final Clinical Impressions(s) / ED Diagnoses   Final diagnoses:  Return to work evaluation    New Prescriptions New Prescriptions   No medications on file     Alvy Bimler 03/02/17 1337    Melene Plan, DO 03/02/17 1457

## 2017-03-02 NOTE — Discharge Instructions (Signed)
Please call [facility] if you need to get established with a primary care provider until your insurance starts in October.  If you develop new or worsening symptoms including inability to control your bladder or bowels, weakness, or numbness in the legs, please return to the emergency department for reevaluation.

## 2017-04-26 ENCOUNTER — Encounter (HOSPITAL_COMMUNITY): Payer: Self-pay | Admitting: Emergency Medicine

## 2017-04-26 ENCOUNTER — Emergency Department (HOSPITAL_COMMUNITY)
Admission: EM | Admit: 2017-04-26 | Discharge: 2017-04-26 | Disposition: A | Discharge status: Home / Self Care | Payer: Self-pay | Attending: Emergency Medicine | Admitting: Emergency Medicine

## 2017-04-26 ENCOUNTER — Emergency Department (HOSPITAL_COMMUNITY): Payer: Self-pay

## 2017-04-26 DIAGNOSIS — Y9289 Other specified places as the place of occurrence of the external cause: Secondary | ICD-10-CM | POA: Insufficient documentation

## 2017-04-26 DIAGNOSIS — Z79899 Other long term (current) drug therapy: Secondary | ICD-10-CM | POA: Insufficient documentation

## 2017-04-26 DIAGNOSIS — Y990 Civilian activity done for income or pay: Secondary | ICD-10-CM | POA: Insufficient documentation

## 2017-04-26 DIAGNOSIS — Y9389 Activity, other specified: Secondary | ICD-10-CM | POA: Insufficient documentation

## 2017-04-26 DIAGNOSIS — F17210 Nicotine dependence, cigarettes, uncomplicated: Secondary | ICD-10-CM | POA: Insufficient documentation

## 2017-04-26 DIAGNOSIS — X509XXA Other and unspecified overexertion or strenuous movements or postures, initial encounter: Secondary | ICD-10-CM | POA: Insufficient documentation

## 2017-04-26 DIAGNOSIS — M25511 Pain in right shoulder: Secondary | ICD-10-CM | POA: Insufficient documentation

## 2017-04-26 NOTE — ED Provider Notes (Signed)
[facility] EMERGENCY DEPARTMENT Provider Note   CSN: [id number] Arrival date & time: 04/26/17  3086     History   Chief Complaint Chief Complaint  Patient presents with  . Shoulder Pain    HPI RANDEL HARGENS is a 36 y.o. male.  Patient presents to the emergency department with chief complaint of right shoulder problem. 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 He denies any pain now. He denies any numbness or tingling. He states that he does feel like he has decreased strength in his right shoulder. He reports having history of shoulder problems on the left. He denies any radiating pain. He has not taken anything for her symptoms. There are no other associated symptoms.   The history is provided by the patient. No language interpreter was used.    Past Medical History:  Diagnosis Date  . Alcohol abuse   . Anxiety   . Benzodiazepine dependence [facility])     Patient Active Problem List   Diagnosis Date Noted  . Alcohol dependence (HCC) 08/03/2013  . Benzodiazepine dependence (HCC) 08/03/2013  . Anxiety state, unspecified 08/03/2013    Past Surgical History:  Procedure Laterality Date  . ROTATOR CUFF REPAIR         Home Medications    Prior to Admission medications   Medication Sig Start Date End Date Taking? Authorizing Provider  gabapentin (NEURONTIN) 300 MG capsule Take 300 mg by mouth 3 (three) times daily. 02/12/17 03/14/17  [provider]  hydrOXYzine (ATARAX/VISTARIL) 25 MG tablet Take 1 tablet (25 mg total) by mouth every 6 (six) hours as needed for anxiety (or CIWA score </= 10). 08/06/13   Charm Rings, NP  methocarbamol (ROBAXIN) 500 MG tablet Take 1 tablet (500 mg total) by mouth 2 (two) times daily. Patient not taking: Reported on 02/15/2017 01/07/17   Mathews Robinsons B, PA-C  sertraline (ZOLOFT) 50 MG tablet Take 100 mg by mouth daily.     [provider]     Family History History reviewed. No pertinent family history.  Social History Social History  Substance Use Topics  . Smoking status: Current Every Day Smoker    Packs/day: 1.00    Types: Cigarettes  . Smokeless tobacco: Never Used  . Alcohol use No     Comment: quit 02/09/17     Allergies   Patient has no known allergies.   Review of Systems Review of Systems  All other systems reviewed and are negative.    Physical Exam Updated Vital Signs BP 125/89 (BP Location: Right Arm)   Pulse 99   Temp 98.6 F (37 C) (Oral)   Resp 18   Ht  (1.803 m)   Wt 99.8 kg (220 lb)   SpO2 100%   BMI 30.68 kg/m   Physical Exam Nursing note and vitals reviewed.  Constitutional: Pt appears well-developed and well-nourished. No distress.  HENT:  Head: Normocephalic and atraumatic.  Eyes: Conjunctivae are normal.  Neck: Normal range of motion.  Cardiovascular: Normal rate, regular rhythm. Intact distal pulses.   Capillary refill < 3 sec.  Pulmonary/Chest: Effort normal and breath sounds normal.  Musculoskeletal:  Right shoulder Pt exhibits no focal tenderness to palpation, positive empty can, positive hawkins-kennedy.   ROM: 5/5  Strength: 3/5  Neurological: Pt  is alert. Coordination normal.  Sensation: 5/5 Skin: Skin is warm and dry. Pt is not diaphoretic.  No evidence  of open wound or skin tenting Psychiatric: Pt has a normal mood and affect.     ED Treatments / Results  Labs (all labs ordered are listed, but only abnormal results are displayed) Labs Reviewed - No data to display  EKG  EKG Interpretation None       Radiology Dg Shoulder Right  Result Date: 04/26/2017 CLINICAL DATA:  Right shoulder pain after lifting injury tonight. Previous dislocations. EXAM: RIGHT SHOULDER - 2+ VIEW COMPARISON:  10/14/2003 FINDINGS: There is deformity of the surgical neck and proximal humeral shaft which probably represents an old fracture deformity. There is no  definite evidence of any acute fracture or dislocation. Coracoclavicular and acromioclavicular spaces are maintained. Soft tissues are unremarkable. No focal bone lesion or bone destruction. IMPRESSION: Probable old fracture deformity of the proximal humerus. No acute fracture or dislocation is identified. Electronically Signed   By: Burman Nieves M.D.   On: 04/26/2017 01:47    Procedures Procedures (including critical care time)  Medications Ordered in ED Medications - No data to display   Initial Impression / Assessment and Plan / ED Course  I have reviewed the triage vital signs and the nursing notes.  Pertinent labs & imaging results that were available during my care of the patient were reviewed by me and considered in my medical decision making (see chart for details).     Patient X-Ray negative for obvious fracture or dislocation.  Pt advised to follow up with orthopedics. Patient offered sling while in ED, but refused, conservative therapy recommended and discussed. Patient will be discharged home & is agreeable with above plan. Returns precautions discussed. Pt appears safe for discharge.   Final Clinical Impressions(s) / ED Diagnoses   Final diagnoses:  Acute pain of right shoulder    New Prescriptions New Prescriptions   No medications on file     Roxy Horseman, PA-C 04/26/17 0244    Shon Baton, MD 04/26/17 [phone]

## 2017-04-26 NOTE — ED Triage Notes (Signed)
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  Hx of dislocation on the left side, with a torn rotator cuff needing surgery.  Pt states his arm is not hurting now, but he came in to be sure he is not deteriorating.

## 2017-04-26 NOTE — Discharge Instructions (Signed)
Please follow-up with the orthopedic.  Your x-ray is normal.

## 2017-04-27 ENCOUNTER — Emergency Department (HOSPITAL_COMMUNITY)
Admission: EM | Admit: 2017-04-27 | Discharge: 2017-04-28 | Disposition: A | Discharge status: Other Acute Inpatient Hospital | Payer: Self-pay | Attending: Emergency Medicine | Admitting: Emergency Medicine

## 2017-04-27 ENCOUNTER — Encounter (HOSPITAL_COMMUNITY): Payer: Self-pay

## 2017-04-27 DIAGNOSIS — F332 Major depressive disorder, recurrent severe without psychotic features: Secondary | ICD-10-CM | POA: Diagnosis present

## 2017-04-27 DIAGNOSIS — F1910 Other psychoactive substance abuse, uncomplicated: Secondary | ICD-10-CM | POA: Insufficient documentation

## 2017-04-27 DIAGNOSIS — F1020 Alcohol dependence, uncomplicated: Secondary | ICD-10-CM | POA: Insufficient documentation

## 2017-04-27 DIAGNOSIS — F17210 Nicotine dependence, cigarettes, uncomplicated: Secondary | ICD-10-CM | POA: Insufficient documentation

## 2017-04-27 DIAGNOSIS — F10220 Alcohol dependence with intoxication, uncomplicated: Secondary | ICD-10-CM

## 2017-04-27 DIAGNOSIS — Z79899 Other long term (current) drug therapy: Secondary | ICD-10-CM | POA: Insufficient documentation

## 2017-04-27 LAB — COMPREHENSIVE METABOLIC PANEL
ALBUMIN: 3.9 g/dL (ref 3.5–5.0)
ALT: 24 U/L (ref 17–63)
AST: 21 U/L (ref 15–41)
Alkaline Phosphatase: 90 U/L (ref 38–126)
Anion gap: 10 (ref 5–15)
BUN: 15 mg/dL (ref 6–20)
CHLORIDE: 112 mmol/L — AB (ref 101–111)
CO2: 23 mmol/L (ref 22–32)
Calcium: 8.3 mg/dL — ABNORMAL LOW (ref 8.9–10.3)
Creatinine, Ser: 0.84 mg/dL (ref 0.61–1.24)
GFR calc Af Amer: >60 mL/min (ref >60)
Glucose, Bld: 91 mg/dL (ref 65–99)
POTASSIUM: 3.9 mmol/L (ref 3.5–5.1)
Sodium: 145 mmol/L (ref 135–145)
Total Bilirubin: 0.4 mg/dL (ref 0.3–1.2)
Total Protein: 7.5 g/dL (ref 6.5–8.1)

## 2017-04-27 LAB — LIPASE, BLOOD: Lipase: 72 U/L — ABNORMAL HIGH (ref 11–51)

## 2017-04-27 LAB — CBC WITH DIFFERENTIAL/PLATELET
BASOS ABS: 0.1 10*3/uL (ref 0.0–0.1)
BASOS PCT: 1 %
EOS ABS: 0.4 10*3/uL (ref 0.0–0.7)
EOS PCT: 5 %
HCT: 42.2 % (ref 39.0–52.0)
Hemoglobin: 14.1 g/dL (ref 13.0–17.0)
Lymphocytes Relative: 49 %
Lymphs Abs: 3.7 10*3/uL (ref 0.7–4.0)
MCH: 29.1 pg (ref 26.0–34.0)
MCHC: 33.4 g/dL (ref 30.0–36.0)
MCV: 87 fL (ref 78.0–100.0)
MONO ABS: 0.3 10*3/uL (ref 0.1–1.0)
Monocytes Relative: 5 %
Neutro Abs: 2.9 10*3/uL (ref 1.7–7.7)
Neutrophils Relative %: 40 %
PLATELETS: 295 10*3/uL (ref 150–400)
RBC: 4.85 MIL/uL (ref 4.22–5.81)
RDW: 16.3 % — AB (ref 11.5–15.5)
WBC: 7.4 10*3/uL (ref 4.0–10.5)

## 2017-04-27 LAB — RAPID URINE DRUG SCREEN, HOSP PERFORMED
AMPHETAMINES: NOT DETECTED
BARBITURATES: NOT DETECTED
BENZODIAZEPINES: NOT DETECTED
COCAINE: NOT DETECTED
Opiates: NOT DETECTED
TETRAHYDROCANNABINOL: NOT DETECTED

## 2017-04-27 LAB — ETHANOL: Alcohol, Ethyl (B): 355 mg/dL (ref <10)

## 2017-04-27 LAB — ACETAMINOPHEN LEVEL: Acetaminophen (Tylenol), Serum: <10 ug/mL — ABNORMAL LOW (ref 10–30)

## 2017-04-27 LAB — SALICYLATE LEVEL: Salicylate Lvl: <7 mg/dL (ref 2.8–30.0)

## 2017-04-27 MED ORDER — LORAZEPAM 2 MG/ML IJ SOLN: 0.0000 mg | Freq: Four times a day (QID) | INTRAMUSCULAR | Status: DC

## 2017-04-27 MED ORDER — LORAZEPAM 1 MG PO TABS
0.0000 mg | ORAL_TABLET | Freq: Two times a day (BID) | ORAL | Status: DC
Start: 2017-04-30 — End: 2017-04-28

## 2017-04-27 MED ORDER — THIAMINE HCL 100 MG/ML IJ SOLN: 100.0000 mg | Freq: Every day | INTRAMUSCULAR | Status: DC

## 2017-04-27 MED ORDER — VITAMIN B-1 100 MG PO TABS
100.0000 mg | ORAL_TABLET | Freq: Every day | ORAL | Status: DC
  Administered 2017-04-27 – 2017-04-28 (×2): 100 mg via ORAL
  Filled 2017-04-27 (×2): qty 1

## 2017-04-27 MED ORDER — LORAZEPAM 1 MG PO TABS
0.0000 mg | ORAL_TABLET | Freq: Four times a day (QID) | ORAL | Status: DC
  Administered 2017-04-27: 1 mg via ORAL
  Administered 2017-04-28: 2 mg via ORAL
  Filled 2017-04-27: qty 1
  Filled 2017-04-27: qty 2

## 2017-04-27 MED ORDER — LORAZEPAM 2 MG/ML IJ SOLN: 0.0000 mg | Freq: Two times a day (BID) | INTRAMUSCULAR | Status: DC

## 2017-04-27 MED ORDER — STERILE WATER FOR INJECTION IJ SOLN
INTRAMUSCULAR | Status: AC
  Administered 2017-04-27: 20:00:00
  Filled 2017-04-27: qty 10

## 2017-04-27 MED ORDER — ZIPRASIDONE MESYLATE 20 MG IM SOLR
20.0000 mg | Freq: Once | INTRAMUSCULAR | Status: DC
  Filled 2017-04-27: qty 20

## 2017-04-27 MED ORDER — SERTRALINE HCL 50 MG PO TABS
100.0000 mg | ORAL_TABLET | Freq: Every day | ORAL | Status: DC
  Administered 2017-04-27 – 2017-04-28 (×2): 100 mg via ORAL
  Filled 2017-04-27 (×2): qty 2

## 2017-04-27 NOTE — ED Notes (Signed)
Bed: WA29 Expected date:  Expected time:  Means of arrival:  Comments: Room 22

## 2017-04-27 NOTE — [facility] Counselor (Signed)
Per Donell SievertSpencer Simon, PA-C: Recommendation of continual observation for safety and stabilization. Re-evaluation by Psychiatry in the AM.   [facility] Attending Provider, Erma HeritageIsaacs, MD, and Consuella LoseElaine, RN notified at 2013.

## 2017-04-27 NOTE — BH Assessment (Signed)
Tele Assessment Note   Patient Name: Benjamin CamelMichael A Lindsey MRN: [id number] Referring Physician: Shaune Pollackameron Isaacs, MD Location of Patient: [facility] Location of Provider: Behavioral Health TTS Department  Benjamin Lindsey is an 36 y.o.single male, who was voluntarily brought into [facility]. Patient stated that he did not understand why he was brought to the ED. Patient reported consumption of wine, to relieve pain, due to recently suffering an injury on his shoulder.  Per medical labs, Patient's Ethanol level was 355 mg/dL at [id number].  Patient reported becoming angry with his father, resulting in him saying that he took several of his prescribed Klonopin.  Patient reported that he lied about the consumption of Klonopin, due to being angry and had no current or past suicidal ideations.   Per medical records 04/27/2017 ([name], RN), "He reportedly told his family that he took 40 of the 0.5 mg doses of Klonopin. Of note, the patient is prescribed 0.5 mg 7 times a day by his psychiatrist." UDS pending at time of assessment.  Patient denies suicidal ideations, homicidal ideations, auditory/visual hallucinations, self-injurious behaviors, or access to weapons.  Patient reported current experiences with depressive symptoms, such as fatigue and angery.    Patient reported currently residing with his parents.  Patient reported having an upcoming court date for the violation of a restraining order completed by his father.  Patient stated that he issue current was resolved, enabling him to return back to his parent's residency.  Patient identified his father as a supportive factor for him.  Patient identified recent stressors associated with a stressful work environment.  Patient denies any history of physical, sexual, or verbal abuse. Patient reported no family history of suicide and substance abuse. Patient stated that he is currently seeing an outpatient psychiatrist, Dr. Jeanie Seweredding, for anxiety, who prescribed him the Klonopin.   Patient stated that he has been compliant with all medications.    During assessment, Patient was cooperative during assessment. Patient was dressed in scrubs and sitting on his bed.  Patient was oriented to time, person, location, however not the situation.  Patient's eye contact was fair.  Patient's motor activity consisted of restlessness and agitation.  Patient's speech was argumentative, loud, and slurred.  Patient's level of consciousness was sedated and irritable.  Patient's mood appeared to be anxious and irritable.  Patient's affect was irritable and angry.  Patient's anxiety level was moderate. Patient's thought process was circumstantial and relevant.  Patient's judgment appeared to be impaired.  Patient stated the he wanted to leave the ED and felt that he had been kidnapped and held against his will.     Diagnosis: Substance Induced Mood Disorder Alcohol Use Disorder  Past Medical History:  Past Medical History:  Diagnosis Date  . Alcohol abuse   . Anxiety   . Benzodiazepine dependence (HCC)     Past Surgical History:  Procedure Laterality Date  . ROTATOR CUFF REPAIR      Family History: No family history on file.  Social History:  reports that he has been smoking Cigarettes.  He has been smoking about 1.00 pack per day. He has never used smokeless tobacco. He reports that he does not drink alcohol or use drugs.  Additional Social History:  Alcohol / Drug Use Pain Medications: See MAR Prescriptions: See MAR Over the Counter: See MAR History of alcohol / drug use?: Yes Substance #1 Name of Substance 1: Alcohol  1 - Age of First Use: Unknown 1 - Amount (size/oz): Unknown 1 -  Frequency: Unknown 1 - Duration: Ongoing 1 - Last Use / Amount: 04/27/2017 Substance #2 Name of Substance 2: Klonopin 2 - Amount (size/oz): Per medical records, Pt. reported taking an overdose, however Pt. denies during assessment.  UDS pending during assessment.  2 - Frequency: Unknown 2 -  Duration: Unknown 2 - Last Use / Amount: Per medical records, 04/27/2017  CIWA: CIWA-Ar BP: 114/80 Pulse Rate: 81 COWS:    PATIENT STRENGTHS: (choose at least two) Average or above average intelligence Psychologist, counselling means Work skills  Allergies: No Known Allergies  Home Medications:  (Not in a hospital admission)  OB/GYN Status:  No LMP for male patient.  General Assessment Data Location of Assessment: [facility] TTS Assessment: In system Is this a Tele or Face-to-Face Assessment?: Face-to-Face Is this an Initial Assessment or a Re-assessment for this encounter?: Initial Assessment Marital status: Single Is patient pregnant?: No Pregnancy Status: No Living Arrangements: Parent (Pt. reported living with his Parents. ) Can pt return to current living arrangement?: Yes Admission Status: Voluntary Is patient capable of signing voluntary admission?: Yes Referral Source: Self/Family/Friend Insurance type: None     Crisis Care Plan Living Arrangements: Parent (Pt. reported living with his Parents. ) Legal Guardian: Other: (Self) Name of Psychiatrist: Dr. Jeanie Sewer Name of Therapist: None  Education Status Is patient currently in school?: No Current Grade: N/A Highest grade of school patient has completed: Some college Name of school: N/A Contact person: N/A  Risk to self with the past 6 months Suicidal Ideation: Yes-Currently Present (Patient denies. Refer to EDP note.) Has patient been a risk to self within the past 6 months prior to admission? : No (Patient denies.) Suicidal Intent: Yes-Currently Present (Patient denies. Refer to EDP note.) Has patient had any suicidal intent within the past 6 months prior to admission? : No (Patient denies.) Is patient at risk for suicide?: Yes (Patient denies. Refer to EDP note.) Suicidal Plan?: Yes-Currently Present (Patient denies. Refer to EDP note.) Has patient had any suicidal plan within the past 6 months prior  to admission? : No (Patient denies.) Specify Current Suicidal Plan: Per medical records, Patient reported taking  "a bunch of Klonipin Access to Means: Yes Specify Access to Suicidal Means: Pt. reported having access to his prescribed Klonopin What has been your use of drugs/alcohol within the last 12 months?: Alcohol Previous Attempts/Gestures: No (Patient denies.) How many times?: 0 Other Self Harm Risks: Patient denies. Triggers for Past Attempts: None known Intentional Self Injurious Behavior: None Family Suicide History: No Recent stressful life event(s): Conflict (Comment), Other (Comment) (Pt. reports conflict with his father and legal problems) Persecutory voices/beliefs?: No Depression: Yes Depression Symptoms: Feeling angry/irritable, Fatigue Substance abuse history and/or treatment for substance abuse?: No (Patient denies.) Suicide prevention information given to non-admitted patients: Not applicable  Risk to Others within the past 6 months Homicidal Ideation: No (Patient denies.) Does patient have any lifetime risk of violence toward others beyond the six months prior to admission? : Unknown (Patient denies.) Thoughts of Harm to Others: No (Patient denies.) Current Homicidal Intent: No Current Homicidal Plan: No Access to Homicidal Means: No (Patient denies.) Identified Victim: Patient denies. History of harm to others?: No (Patient denies.) Assessment of Violence: None Noted Violent Behavior Description: Patient denies. Does patient have access to weapons?: No (Patient denies.) Criminal Charges Pending?: Yes Describe Pending Criminal Charges: Pt. reported having pending charges for violation of a restraining order. Does patient have a court date: Yes Court Date:  (Pt.  reported being unsure of the upcoming court date.) Is patient on probation?: No  Psychosis Hallucinations: None noted Delusions: None noted  Mental Status Report Appearance/Hygiene: In scrubs Eye  Contact: Fair Motor Activity: Agitation, Restlessness Speech: Argumentative, Loud, Slurred Level of Consciousness: Sedated, Irritable Mood: Anxious, Irritable Affect: Irritable, Angry Anxiety Level: Moderate Thought Processes: Circumstantial, Relevant Judgement: Impaired Orientation: Person, Place, Time Obsessive Compulsive Thoughts/Behaviors: None  Cognitive Functioning Concentration: Poor Memory: Remote Intact, Recent Impaired IQ: Average Insight: Poor Impulse Control: Poor Appetite: Good Weight Loss: 0 Weight Gain: 0 Sleep: No Change Total Hours of Sleep: 10 Vegetative Symptoms: None  ADLScreening [facility] Assessment Services) Patient's cognitive ability adequate to safely complete daily activities?: Yes Patient able to express need for assistance with ADLs?: Yes Independently performs ADLs?: Yes (appropriate for developmental age)  Prior Inpatient Therapy Prior Inpatient Therapy: No (Patient denies.) Prior Therapy Dates: None Prior Therapy Facilty/Provider(s): None Reason for Treatment: None  Prior Outpatient Therapy Prior Outpatient Therapy: Yes Prior Therapy Dates: Ongoing Prior Therapy Facilty/Provider(s): Dr. Jeanie Sewer Reason for Treatment: Anxiety Does patient have an ACCT team?: No Does patient have Intensive In-House Services?  : No Does patient have [facility] services? : No Does patient have P4CC services?: No  ADL Screening (condition at time of admission) Patient's cognitive ability adequate to safely complete daily activities?: Yes Is the patient deaf or have difficulty hearing?: No Does the patient have difficulty seeing, even when wearing glasses/contacts?: No Does the patient have difficulty concentrating, remembering, or making decisions?: No Patient able to express need for assistance with ADLs?: Yes Does the patient have difficulty dressing or bathing?: No Independently performs ADLs?: Yes (appropriate for developmental age) Does the patient have  difficulty walking or climbing stairs?: No Weakness of Legs: None Weakness of Arms/Hands: None  Home Assistive Devices/Equipment Home Assistive Devices/Equipment: None    Abuse/Neglect Assessment (Assessment to be complete while patient is alone) Physical Abuse: Denies Verbal Abuse: Denies Sexual Abuse: Denies Exploitation of patient/patient's resources: Denies Self-Neglect: Denies     Merchant navy officer (For Healthcare) Does Patient Have a Medical Advance Directive?: No Would patient like information on creating a medical advance directive?: No - Patient declined    Additional Information 1:1 In Past 12 Months?: No CIRT Risk: No Elopement Risk: No Does patient have medical clearance?: Yes     Disposition:  Disposition Initial Assessment Completed for this Encounter: Yes Disposition of Patient: Other dispositions, Re-evaluation by Psychiatry recommended (Per Donell Sievert, PA-C) Other disposition(s): Other (Comment) (Continual observation for safety and stabilization)  This service was provided via telemedicine using a 2-way, interactive audio and video technology.    Talbert Nan 04/27/2017 8:35 PM

## 2017-04-27 NOTE — ED Notes (Signed)
Patient denies SI/HI?AVH at this time. Snack given. Plan if care discussed. Encouragement and support provided and safety maintain. Q 15 min safety checks remain in place and video monitoring.

## 2017-04-27 NOTE — ED Notes (Signed)
Dr. Erma HeritageIsaacs in pt.'s room ,told this Nurse to "hold on " to [facility] for [facility], pt. Agreed to be seen by TTS. Pt. Moved to TCU room 29 on scrubs and wanded by security. Belongings kept in the locker assigned.

## 2017-04-27 NOTE — ED Provider Notes (Signed)
[facility]-EMERGENCY DEPT Provider Note   CSN: [id number] Arrival date & time: 04/27/17  1557     History   Chief Complaint Chief Complaint  Patient presents with  . Drug Overdose    HPI Benjamin Lindsey is a 36 y.o. male.  HPI   36 year old male here with reported benzodiazepine overdose. The patient was brought in by EMS. He reportedly was recently injured at work. He got into an argument with his supervisor today. He reportedly told his family that he took 40 of the 0.5 mg doses of Klonopin. Of note, the patient is prescribed 0.5 mg 7 times a day by his psychiatrist. He takes these regularly. He also admits to drinking, which she states is not unusual for him. He currently denies any complaints. He does not know why he is here. Denies any suicidal ideation, but per report from EMS, family was concerned about this.   Level 5 caveat invoked as remainder of history, ROS, and physical exam limited due to patient's AMS, intoxication.   Past Medical History:  Diagnosis Date  . Alcohol abuse   . Anxiety   . Benzodiazepine dependence [facility](HCC)     Patient Active Problem List   Diagnosis Date Noted  . Alcohol dependence (HCC) 08/03/2013  . Benzodiazepine dependence (HCC) 08/03/2013  . Anxiety state, unspecified 08/03/2013    Past Surgical History:  Procedure Laterality Date  . ROTATOR CUFF REPAIR         Home Medications    Prior to Admission medications   Medication Sig Start Date End Date Taking? Authorizing Provider  acetaminophen (TYLENOL) 325 MG tablet Take 650 mg by mouth daily as needed for mild pain.   Yes [provider]  clonazePAM (KLONOPIN) 0.5 MG tablet Take 0.5 mg by mouth 4 (four) times daily.   Yes [provider]  sertraline (ZOLOFT) 50 MG tablet Take 100 mg by mouth daily.    Yes [provider]  Tetrahydrozoline HCl (VISINE OP) Place 1 drop into both eyes daily as needed (REDNESS).   Yes [provider]  hydrOXYzine (ATARAX/VISTARIL) 25 MG tablet Take 1 tablet (25 mg total) by mouth every 6 (six) hours as needed for anxiety (or CIWA score </= 10). Patient not taking: Reported on 04/27/2017 08/06/13   Charm RingsLord, Jamison Y, NP  methocarbamol (ROBAXIN) 500 MG tablet Take 1 tablet (500 mg total) by mouth 2 (two) times daily. Patient not taking: Reported on 02/15/2017 01/07/17   Gregary CromerMitchell, Jessica B, PA-C    Family History No family history on file.  Social History Social History  Substance Use Topics  . Smoking status: Current Every Day Smoker    Packs/day: 1.00    Types: Cigarettes  . Smokeless tobacco: Never Used  . Alcohol use No     Comment: quit 02/09/17     Allergies   Patient has no known allergies.   Review of Systems Review of Systems  Constitutional: Negative for chills, fatigue and fever.  HENT: Negative for congestion and rhinorrhea.   Eyes: Negative for visual disturbance.  Respiratory: Negative for cough and wheezing.   Cardiovascular: Negative for chest pain and leg swelling.  Gastrointestinal: Negative for abdominal pain, diarrhea, nausea and vomiting.  Genitourinary: Negative for dysuria and flank pain.  Musculoskeletal: Negative for neck pain and neck stiffness.  Skin: Negative for rash and wound.  Allergic/Immunologic: Negative for immunocompromised state.  Neurological: Negative for syncope, weakness and headaches.  Psychiatric/Behavioral: Positive for agitation, behavioral problems and  confusion.  All other systems reviewed and are negative.    Physical Exam Updated Vital Signs BP 113/73 (BP Location: Right Arm)   Pulse 83   Temp 98.2 F (36.8 C) (Oral)   Resp 18   SpO2 97%   Physical Exam  Constitutional: He appears well-developed and well-nourished. No distress.  Disheveled, smells of alcohol  HENT:  Head: Normocephalic and atraumatic.  Eyes: Conjunctivae are normal.  Neck: Neck supple.  Cardiovascular: Normal rate, regular rhythm and  normal heart sounds.  Exam reveals no friction rub.   No murmur heard. Pulmonary/Chest: Effort normal and breath sounds normal. No respiratory distress. He has no wheezes. He has no rales.  Abdominal: He exhibits no distension.  Musculoskeletal: He exhibits no edema.  Neurological: He is alert. He exhibits normal muscle tone.  Speech mildly slurred. Moves all extremities with 5 out of 5 strength. Normal sensation to light touch. Cranial nerves intact.  Skin: Skin is warm. Capillary refill takes less than 2 seconds.  Psychiatric: His affect is labile. His speech is slurred. He is agitated. Cognition and memory are impaired. He expresses impulsivity and inappropriate judgment. He is inattentive.  Nursing note and vitals reviewed.    ED Treatments / Results  Labs (all labs ordered are listed, but only abnormal results are displayed) Labs Reviewed  CBC WITH DIFFERENTIAL/PLATELET - Abnormal; Notable for the following:       Result Value   RDW 16.3 (*)    All other components within normal limits  COMPREHENSIVE METABOLIC PANEL - Abnormal; Notable for the following:    Chloride 112 (*)    Calcium 8.3 (*)    All other components within normal limits  ETHANOL - Abnormal; Notable for the following:    Alcohol, Ethyl (B) 355 (*)    All other components within normal limits  LIPASE, BLOOD - Abnormal; Notable for the following:    Lipase 72 (*)    All other components within normal limits  ACETAMINOPHEN LEVEL - Abnormal; Notable for the following:    Acetaminophen (Tylenol), Serum <10 (*)    All other components within normal limits  SALICYLATE LEVEL  RAPID URINE DRUG SCREEN, HOSP PERFORMED    EKG  EKG Interpretation None       Radiology No results found.  Procedures Procedures (including critical care time)  Medications Ordered in ED Medications  ziprasidone (GEODON) injection 20 mg (20 mg Intramuscular Not Given 04/27/17 2120)  LORazepam (ATIVAN) injection 0-4 mg (0 mg  Intravenous Not Given 04/28/17 0206)    Or  LORazepam (ATIVAN) tablet 0-4 mg ( Oral See Alternative 04/28/17 0206)  LORazepam (ATIVAN) injection 0-4 mg (not administered)    Or  LORazepam (ATIVAN) tablet 0-4 mg (not administered)  thiamine (VITAMIN B-1) tablet 100 mg (100 mg Oral Given 04/27/17 2112)    Or  thiamine (B-1) injection 100 mg ( Intravenous See Alternative 04/27/17 2112)  sterile water (preservative free) injection (not administered)  sertraline (ZOLOFT) tablet 100 mg (100 mg Oral Given 04/27/17 2112)     Initial Impression / Assessment and Plan / ED Course  I have reviewed the triage vital signs and the nursing notes.  Pertinent labs & imaging results that were available during my care of the patient were reviewed by me and considered in my medical decision making (see chart for details).     36 yo M here with reported klonopin overdose and EtOH intoxication. Pt smells of alcohol on arrival, speech is slurred. Denying SI at  this time but is very evasive, will not answer question directly re: taking the klonpin and mother was highly concerned about him harming himself. He has a significant recent stressor at his work. Will consult TTS.  Labs as above. Alcohol 355. Pt remains slurred, drowsy but is becoming incredibly agitated. I was able to verbally redirect him and notified him of need for TTS eval.  Shortly after discussion with pt, he attempted to elope from ED. He states he does not remember our prior conversation. Given pt's psychiatric and substance abuse history, active intoxication with concern for intentional klonopin OD in setting of emotional stressor, as well as his significant impaired judgment and impulsivity, I feel he is at high risk of endangering himself and/or others should he leave. IVC placed. TTS consulted. I discussed this with pt in detail.   Final Clinical Impressions(s) / ED Diagnoses   Final diagnoses:  Polysubstance abuse (HCC)  Alcohol  dependence with uncomplicated intoxication (HCC)    New Prescriptions New Prescriptions   No medications on file     Shaune Pollack, MD 04/28/17 [phone]

## 2017-04-27 NOTE — ED Notes (Signed)
Bed: WA22 Expected date:  Expected time:  Means of arrival:  Comments: EMS/overdose 

## 2017-04-27 NOTE — ED Notes (Signed)
TTS assessment in progress. 

## 2017-04-27 NOTE — ED Notes (Signed)
Pt stated "I didn't take the pills but I played it out because my dad found a bottle of wine in my room and he's anti-alcohol.  I'm supposed to be at work [employer].  I want to call my lawyer, this is crazy."

## 2017-04-27 NOTE — ED Triage Notes (Signed)
His mother phoned (with whom he resides) r/t pt. "took a bunch of Klonipin". Pt. Arrives awake and somewhat drowsy and in no distress. He will not say why he took "a handfull" of Klonipin, because he "do not want to be put in the psych. Ward". [sic]

## 2017-04-28 ENCOUNTER — Inpatient Hospital Stay (HOSPITAL_COMMUNITY)
Admission: AD | Admit: 2017-04-28 | Discharge: 2017-05-02 | DRG: 885 | Disposition: A | Discharge status: Home / Self Care | Payer: Federal, State, Local not specified - Other | Source: Intra-hospital | Attending: Psychiatry | Admitting: Psychiatry

## 2017-04-28 ENCOUNTER — Encounter (HOSPITAL_COMMUNITY): Payer: Self-pay | Admitting: *Deleted

## 2017-04-28 DIAGNOSIS — F332 Major depressive disorder, recurrent severe without psychotic features: Principal | ICD-10-CM | POA: Diagnosis present

## 2017-04-28 DIAGNOSIS — F17210 Nicotine dependence, cigarettes, uncomplicated: Secondary | ICD-10-CM | POA: Diagnosis present

## 2017-04-28 DIAGNOSIS — Z79899 Other long term (current) drug therapy: Secondary | ICD-10-CM

## 2017-04-28 DIAGNOSIS — Z564 Discord with boss and workmates: Secondary | ICD-10-CM

## 2017-04-28 DIAGNOSIS — F1320 Sedative, hypnotic or anxiolytic dependence, uncomplicated: Secondary | ICD-10-CM | POA: Diagnosis present

## 2017-04-28 DIAGNOSIS — Z23 Encounter for immunization: Secondary | ICD-10-CM

## 2017-04-28 DIAGNOSIS — R45851 Suicidal ideations: Secondary | ICD-10-CM

## 2017-04-28 DIAGNOSIS — Y909 Presence of alcohol in blood, level not specified: Secondary | ICD-10-CM

## 2017-04-28 DIAGNOSIS — F411 Generalized anxiety disorder: Secondary | ICD-10-CM | POA: Diagnosis present

## 2017-04-28 DIAGNOSIS — T424X2A Poisoning by benzodiazepines, intentional self-harm, initial encounter: Secondary | ICD-10-CM

## 2017-04-28 DIAGNOSIS — T1491XA Suicide attempt, initial encounter: Secondary | ICD-10-CM

## 2017-04-28 DIAGNOSIS — F1020 Alcohol dependence, uncomplicated: Secondary | ICD-10-CM | POA: Diagnosis present

## 2017-04-28 DIAGNOSIS — F10239 Alcohol dependence with withdrawal, unspecified: Secondary | ICD-10-CM

## 2017-04-28 MED ORDER — GABAPENTIN 100 MG PO CAPS
200.0000 mg | ORAL_CAPSULE | Freq: Two times a day (BID) | ORAL | Status: DC
  Administered 2017-04-28 (×2): 200 mg via ORAL
  Filled 2017-04-28 (×2): qty 2

## 2017-04-28 MED ORDER — INFLUENZA VAC SPLIT QUAD 0.5 ML IM SUSY
0.5000 mL | PREFILLED_SYRINGE | INTRAMUSCULAR | Status: AC
Start: 2017-04-29 — End: 2017-04-30
  Administered 2017-04-30: 0.5 mL via INTRAMUSCULAR
  Filled 2017-04-28: qty 0.5

## 2017-04-28 MED ORDER — CHLORDIAZEPOXIDE HCL 25 MG PO CAPS
25.0000 mg | ORAL_CAPSULE | Freq: Four times a day (QID) | ORAL | Status: AC
  Administered 2017-04-29 (×4): 25 mg via ORAL
  Filled 2017-04-28 (×3): qty 1

## 2017-04-28 MED ORDER — CHLORDIAZEPOXIDE HCL 25 MG PO CAPS: 25.0000 mg | ORAL_CAPSULE | Freq: Four times a day (QID) | ORAL | Status: AC | PRN

## 2017-04-28 MED ORDER — VITAMIN B-1 100 MG PO TABS
100.0000 mg | ORAL_TABLET | Freq: Every day | ORAL | Status: DC
  Administered 2017-04-29 – 2017-05-02 (×4): 100 mg via ORAL
  Filled 2017-04-28 (×6): qty 1

## 2017-04-28 MED ORDER — ACETAMINOPHEN 325 MG PO TABS
650.0000 mg | ORAL_TABLET | Freq: Four times a day (QID) | ORAL | Status: DC | PRN
  Administered 2017-04-29: 650 mg via ORAL
  Filled 2017-04-28: qty 2

## 2017-04-28 MED ORDER — MAGNESIUM HYDROXIDE 400 MG/5ML PO SUSP: 30.0000 mL | Freq: Every day | ORAL | Status: DC | PRN

## 2017-04-28 MED ORDER — SERTRALINE HCL 100 MG PO TABS
100.0000 mg | ORAL_TABLET | Freq: Every day | ORAL | Status: DC
  Administered 2017-04-29 – 2017-05-02 (×4): 100 mg via ORAL
  Filled 2017-04-28 (×3): qty 1
  Filled 2017-04-28: qty 7
  Filled 2017-04-28 (×2): qty 1

## 2017-04-28 MED ORDER — TRAZODONE HCL 50 MG PO TABS
50.0000 mg | ORAL_TABLET | Freq: Every evening | ORAL | Status: DC | PRN
  Administered 2017-04-28 – 2017-05-01 (×4): 50 mg via ORAL
  Filled 2017-04-28: qty 1
  Filled 2017-04-28: qty 7
  Filled 2017-04-28 (×3): qty 1

## 2017-04-28 MED ORDER — CHLORDIAZEPOXIDE HCL 25 MG PO CAPS
25.0000 mg | ORAL_CAPSULE | Freq: Every day | ORAL | Status: AC
  Administered 2017-05-02: 25 mg via ORAL
  Filled 2017-04-28: qty 1

## 2017-04-28 MED ORDER — PNEUMOCOCCAL VAC POLYVALENT 25 MCG/0.5ML IJ INJ
0.5000 mL | INJECTION | INTRAMUSCULAR | Status: AC
  Administered 2017-04-30: 0.5 mL via INTRAMUSCULAR

## 2017-04-28 MED ORDER — ONDANSETRON 4 MG PO TBDP: 4.0000 mg | ORAL_TABLET | Freq: Four times a day (QID) | ORAL | Status: AC | PRN

## 2017-04-28 MED ORDER — LOPERAMIDE HCL 2 MG PO CAPS: 2.0000 mg | ORAL_CAPSULE | ORAL | Status: AC | PRN

## 2017-04-28 MED ORDER — GABAPENTIN 100 MG PO CAPS
200.0000 mg | ORAL_CAPSULE | Freq: Two times a day (BID) | ORAL | Status: DC
  Administered 2017-04-29 – 2017-05-02 (×7): 200 mg via ORAL
  Filled 2017-04-28: qty 28
  Filled 2017-04-28 (×5): qty 2
  Filled 2017-04-28: qty 28
  Filled 2017-04-28 (×4): qty 2

## 2017-04-28 MED ORDER — CHLORDIAZEPOXIDE HCL 25 MG PO CAPS
25.0000 mg | ORAL_CAPSULE | Freq: Three times a day (TID) | ORAL | Status: AC
  Administered 2017-04-30 (×3): 25 mg via ORAL
  Filled 2017-04-28 (×3): qty 1

## 2017-04-28 MED ORDER — CHLORDIAZEPOXIDE HCL 25 MG PO CAPS
25.0000 mg | ORAL_CAPSULE | ORAL | Status: AC
  Administered 2017-05-01 (×2): 25 mg via ORAL
  Filled 2017-04-28 (×2): qty 1

## 2017-04-28 MED ORDER — HYDROXYZINE HCL 25 MG PO TABS
25.0000 mg | ORAL_TABLET | Freq: Three times a day (TID) | ORAL | Status: DC | PRN
  Administered 2017-04-28 – 2017-05-01 (×4): 25 mg via ORAL
  Filled 2017-04-28: qty 1
  Filled 2017-04-28 (×2): qty 10
  Filled 2017-04-28 (×3): qty 1

## 2017-04-28 MED ORDER — ALUM & MAG HYDROXIDE-SIMETH 200-200-20 MG/5ML PO SUSP: 30.0000 mL | ORAL | Status: DC | PRN

## 2017-04-28 NOTE — ED Notes (Addendum)
[facility]. [facility] Accepting Nurse not yet ready for the patient. Otto Herbreka RN to call for report when she is ready.

## 2017-04-28 NOTE — ED Notes (Signed)
Patient denies pain and is resting comfortably.  

## 2017-04-28 NOTE — ED Notes (Signed)
Unable to give report. [name] noted nurse will call back for report

## 2017-04-28 NOTE — Progress Notes (Signed)
Admission Note:  36 year old male who presents IVC, in no acute distress, for the treatment of SI.  Patient appears anxious. Patient was calm and cooperative with admission process. On admission, patient has complaints of "cold sweats" and "anxiety".  Patient currently denies SI and contracts for safety upon admission. Patient denies AVH.  [facility] to "My dad is anti-drinking and I live with him. He found my wine bottles and was going to kick me out so I lied and told him that I overdosed on my Klonopin and he had me committed".  [facility] only stressor stating "being here increases my anxiety because I think I'm going to lose my job being stuck here".  Patient reports increased drinking recently and increased anxiety.  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  [facility]  Patient currently lives with "dad, mom, sister, and niece" and identifies "family" as his support system.  [facility], patient would like to "get back to work" and "never drink wine again".  Skin was assessed and found to be clear of any abnormal marks.  Patient searched and no contraband found, POC and unit policies explained and understanding verbalized. Consents obtained. Patient had no additional questions or concerns. Safety maintained on the unit.

## 2017-04-28 NOTE — Progress Notes (Signed)
Pt arrived and during admission pt was given his search and belongings sheet he ask where was his shoes? I showed pt all of his belongings and stated there were no shoes in his bag. Pt stated he had shoes but then stated maybe I didn't have them. I called [facility] and spoke with Solmon IceNiki and explained to her what the patient stated and she stated pt had no shoes in his belongings or on his belongings sheet and he was really intoxicated when he arrived. Information was delivered to patient. Pt the stated ok maybe I didn't have any on.

## 2017-04-28 NOTE — ED Notes (Signed)
Pt no noted distress. Denies SI/HI/AVH. Pt noted, "I went to [employer] yesterday seen that wine was for sale $3.99, I usually don't drink wine. I usually drink beer. My dad is anti-drinker, he threaten to kick me out, so I told him I took some Klonopin to keep him from kicking me out. I did not take any pills." Goal plan is to be discharge, so he can go to work. Staff will continue to maintain safety and meet needs.

## 2017-04-28 NOTE — ED Notes (Signed)
[facility] [employer]. Ambulatory and in stable condition. No signs of distress noted. Vital signs WNL (see chart). Belongings given to patient.

## 2017-04-28 NOTE — Progress Notes (Signed)
04/28/17 1357:  LRT went to pt room to offer activities, pt declined.   Caroll RancherMarjette Cortlandt Capuano, LRT/CTRS

## 2017-04-28 NOTE — ED Notes (Signed)
Transportation called. ([employer])

## 2017-04-28 NOTE — BH Assessment (Signed)
[facility] Assessment Progress Note  Per Thedore MinsMojeed Akintayo, MD, this pt requires psychiatric hospitalization.  Malva LimesLinsey Strader, RN, [id number]; they will contact [facility] when they are ready to receive pt.  Pt presents under IVC, and [facility].  Pt's nurse, Angelique BlonderDenise, has been notified, and agrees to call report to [phone].  Pt is to be transported via [employer].   Doylene Canninghomas Adalie Mand, MA Triage Specialist [phone]

## 2017-04-28 NOTE — Consult Note (Signed)
[facility] Psychiatry Consult   Reason for Consult:  Suicide attempt by overdose Referring Physician:  EDP Patient Identification: Benjamin Lindsey MRN:  [id number] Principal Diagnosis: Major depressive disorder, recurrent severe without psychotic features [facility]) Diagnosis:   Patient Active Problem List   Diagnosis Date Noted  . Major depressive disorder, recurrent severe without psychotic features ([place]) [F33.2] 04/28/2017  . Alcohol dependence ([place]) [F10.20] 08/03/2013  . Benzodiazepine dependence ([place]) [F13.20] 08/03/2013  . Anxiety state, unspecified [F41.1] 08/03/2013    Total Time spent with patient: 45 minutes  Subjective:   Benjamin Lindsey is a 36 y.o. male patient admitted with suicide attempt with a bunch of Klonopin.  HPI:  Patient who reports history of depression and severe alcohol use disorder who was IVC'd by his family after he told them that he overdosed on 40 tablets of 0.5 mg Klonopin. Patient reports that he got into argument with his supervisor following which he got upset drank alcohol and overdosed on Klonopin. Patient states that he receives medications from Dr. Reece Levy for Generalized anxiety disorder. He denies psychosis, delusions and illicit drug use.  Past Psychiatric History: as above  Risk to Self: Suicidal Ideation: Yes-Currently Present (Patient denies. Refer to EDP note.) Suicidal Intent: Yes-Currently Present (Patient denies. Refer to EDP note.) Is patient at risk for suicide?: Yes (Patient denies. Refer to EDP note.) Suicidal Plan?: Yes-Currently Present (Patient denies. Refer to EDP note.) Specify Current Suicidal Plan: Per medical records, Patient reported taking  "a bunch of Klonipin Access to Means: Yes Specify Access to Suicidal Means: Pt. reported having access to his prescribed Klonopin What has been your use of drugs/alcohol within the last 12 months?: Alcohol How many times?: 0 Other Self Harm Risks: Patient denies. Triggers for Past  Attempts: None known Intentional Self Injurious Behavior: None Risk to Others: Homicidal Ideation: No (Patient denies.) Thoughts of Harm to Others: No (Patient denies.) Current Homicidal Intent: No Current Homicidal Plan: No Access to Homicidal Means: No (Patient denies.) Identified Victim: Patient denies. History of harm to others?: No (Patient denies.) Assessment of Violence: None Noted Violent Behavior Description: Patient denies. Does patient have access to weapons?: No (Patient denies.) Criminal Charges Pending?: Yes Describe Pending Criminal Charges: Pt. reported having pending charges for violation of a restraining order. Does patient have a court date: Yes Court Date:  (Pt. reported being unsure of the upcoming court date.) Prior Inpatient Therapy: Prior Inpatient Therapy: No (Patient denies.) Prior Therapy Dates: None Prior Therapy Facilty/Provider(s): None Reason for Treatment: None Prior Outpatient Therapy: Prior Outpatient Therapy: Yes Prior Therapy Dates: Ongoing Prior Therapy Facilty/Provider(s): Dr. Lin Landsman Reason for Treatment: Anxiety Does patient have an ACCT team?: No Does patient have Intensive In-House Services?  : No Does patient have [facility] services? : No Does patient have [facility] services?: No  Past Medical History:  Past Medical History:  Diagnosis Date  . Alcohol abuse   . Anxiety   . Benzodiazepine dependence ([place])     Past Surgical History:  Procedure Laterality Date  . ROTATOR CUFF REPAIR     Family History: No family history on file. Family Psychiatric  History:  Social History:  History  Alcohol Use No    Comment: quit 02/09/17     History  Drug Use No    Social History   Social History  . Marital status: Single    Spouse name: N/A  . Number of children: N/A  . Years of education: N/A   Social History Main Topics  .  Smoking status: Current Every Day Smoker    Packs/day: 1.00    Types: Cigarettes  . Smokeless tobacco: Never Used   . Alcohol use No     Comment: quit 02/09/17  . Drug use: No  . Sexual activity: Not Asked   Other Topics Concern  . None   Social History Narrative  . None   Additional Social History:    Allergies:  No Known Allergies  Labs:  Results for orders placed or performed during the hospital encounter of 04/27/17 (from the past 48 hour(s))  CBC with Differential     Status: Abnormal   Collection Time: 04/27/17  4:46 PM  Result Value Ref Range   WBC 7.4 4.0 - 10.5 K/uL   RBC 4.85 4.22 - 5.81 MIL/uL   Hemoglobin 14.1 13.0 - 17.0 g/dL   HCT 42.2 39.0 - 52.0 %   MCV 87.0 78.0 - 100.0 fL   MCH 29.1 26.0 - 34.0 pg   MCHC 33.4 30.0 - 36.0 g/dL   RDW 16.3 (H) 11.5 - 15.5 %   Platelets 295 150 - 400 K/uL   Neutrophils Relative % 40 %   Neutro Abs 2.9 1.7 - 7.7 K/uL   Lymphocytes Relative 49 %   Lymphs Abs 3.7 0.7 - 4.0 K/uL   Monocytes Relative 5 %   Monocytes Absolute 0.3 0.1 - 1.0 K/uL   Eosinophils Relative 5 %   Eosinophils Absolute 0.4 0.0 - 0.7 K/uL   Basophils Relative 1 %   Basophils Absolute 0.1 0.0 - 0.1 K/uL  Comprehensive metabolic panel     Status: Abnormal   Collection Time: 04/27/17  4:46 PM  Result Value Ref Range   Sodium 145 135 - 145 mmol/L   Potassium 3.9 3.5 - 5.1 mmol/L   Chloride 112 (H) 101 - 111 mmol/L   CO2 23 22 - 32 mmol/L   Glucose, Bld 91 65 - 99 mg/dL   BUN 15 6 - 20 mg/dL   Creatinine, Ser 0.84 0.61 - 1.24 mg/dL   Calcium 8.3 (L) 8.9 - 10.3 mg/dL   Total Protein 7.5 6.5 - 8.1 g/dL   Albumin 3.9 3.5 - 5.0 g/dL   AST 21 15 - 41 U/L   ALT 24 17 - 63 U/L   Alkaline Phosphatase 90 38 - 126 U/L   Total Bilirubin 0.4 0.3 - 1.2 mg/dL   GFR calc non Af Amer >60 >60 mL/min   GFR calc Af Amer >60 >60 mL/min    Comment: (NOTE) The eGFR has been calculated using the CKD EPI equation. This calculation has not been validated in all clinical situations. eGFR's persistently <60 mL/min signify possible Chronic Kidney Disease.    Anion gap 10 5 - 15   Ethanol     Status: Abnormal   Collection Time: 04/27/17  4:46 PM  Result Value Ref Range   Alcohol, Ethyl (B) 355 (HH) <10 mg/dL    Comment:        LOWEST DETECTABLE LIMIT FOR SERUM ALCOHOL IS 10 mg/dL FOR MEDICAL PURPOSES ONLY CRITICAL RESULT CALLED TO, READ BACK BY AND VERIFIED WITH: SMITH,T. RN _0  ON 10.17.18 BY COHEN,K   Lipase, blood     Status: Abnormal   Collection Time: 04/27/17  4:46 PM  Result Value Ref Range   Lipase 72 (H) 11 - 51 U/L  Acetaminophen level     Status: Abnormal   Collection Time: 04/27/17  4:46 PM  Result Value Ref Range   Acetaminophen (  Tylenol), Serum <10 (L) 10 - 30 ug/mL    Comment:        THERAPEUTIC CONCENTRATIONS VARY SIGNIFICANTLY. A RANGE OF 10-30 ug/mL MAY BE AN EFFECTIVE CONCENTRATION FOR MANY PATIENTS. HOWEVER, SOME ARE BEST TREATED AT CONCENTRATIONS OUTSIDE THIS RANGE. ACETAMINOPHEN CONCENTRATIONS >150 ug/mL AT 4 HOURS AFTER INGESTION AND >50 ug/mL AT 12 HOURS AFTER INGESTION ARE OFTEN ASSOCIATED WITH TOXIC REACTIONS.   Salicylate level     Status: None   Collection Time: 04/27/17  4:46 PM  Result Value Ref Range   Salicylate Lvl <7.0 2.8 - 30.0 mg/dL  Rapid urine drug screen (hospital performed)     Status: None   Collection Time: 04/27/17  7:58 PM  Result Value Ref Range   Opiates NONE DETECTED NONE DETECTED   Cocaine NONE DETECTED NONE DETECTED   Benzodiazepines NONE DETECTED NONE DETECTED   Amphetamines NONE DETECTED NONE DETECTED   Tetrahydrocannabinol NONE DETECTED NONE DETECTED   Barbiturates NONE DETECTED NONE DETECTED    Comment:        DRUG SCREEN FOR MEDICAL PURPOSES ONLY.  IF CONFIRMATION IS NEEDED FOR ANY PURPOSE, NOTIFY LAB WITHIN 5 DAYS.        LOWEST DETECTABLE LIMITS FOR URINE DRUG SCREEN Drug Class       Cutoff (ng/mL) Amphetamine      1000 Barbiturate      200 Benzodiazepine   200 Tricyclics       300 Opiates          300 Cocaine          300 THC              50     Current  Facility-Administered Medications  Medication Dose Route Frequency Provider Last Rate Last Dose  . gabapentin (NEURONTIN) capsule 200 mg  200 mg Oral BID Haila Dena, MD      . LORazepam (ATIVAN) injection 0-4 mg  0-4 mg Intravenous Q6H Shaune Pollack, MD       Or  . LORazepam (ATIVAN) tablet 0-4 mg  0-4 mg Oral Q6H Shaune Pollack, MD   1 mg at 04/27/17 2112  . [START ON 04/30/2017] LORazepam (ATIVAN) injection 0-4 mg  0-4 mg Intravenous Q12H Shaune Pollack, MD       Or  . Melene Muller ON 04/30/2017] LORazepam (ATIVAN) tablet 0-4 mg  0-4 mg Oral Q12H Shaune Pollack, MD      . sertraline (ZOLOFT) tablet 100 mg  100 mg Oral Daily Shaune Pollack, MD   100 mg at 04/28/17 0939  . thiamine (VITAMIN B-1) tablet 100 mg  100 mg Oral Daily Shaune Pollack, MD   100 mg at 04/28/17 7184   Or  . thiamine (B-1) injection 100 mg  100 mg Intravenous Daily Shaune Pollack, MD      . ziprasidone (GEODON) injection 20 mg  20 mg Intramuscular Once Shaune Pollack, MD       Current Outpatient Prescriptions  Medication Sig Dispense Refill  . acetaminophen (TYLENOL) 325 MG tablet Take 650 mg by mouth daily as needed for mild pain.    . clonazePAM (KLONOPIN) 0.5 MG tablet Take 0.5 mg by mouth 4 (four) times daily.    . sertraline (ZOLOFT) 50 MG tablet Take 100 mg by mouth daily.     . Tetrahydrozoline HCl (VISINE OP) Place 1 drop into both eyes daily as needed (REDNESS).    . hydrOXYzine (ATARAX/VISTARIL) 25 MG tablet Take 1 tablet (25 mg total) by mouth every 6 (six)  hours as needed for anxiety (or CIWA score </= 10). (Patient not taking: Reported on 04/27/2017) 30 tablet 0  . methocarbamol (ROBAXIN) 500 MG tablet Take 1 tablet (500 mg total) by mouth 2 (two) times daily. (Patient not taking: Reported on 02/15/2017) 10 tablet 0    Musculoskeletal: Strength & Muscle Tone: within normal limits Gait & Station: normal Patient leans: N/A  Psychiatric Specialty Exam: Physical Exam  Psychiatric: His speech is  normal and behavior is normal. His mood appears anxious. Cognition and memory are normal. He expresses impulsivity. He exhibits a depressed mood. He expresses suicidal ideation.    Review of Systems  Constitutional: Negative.   HENT: Negative.   Eyes: Negative.   Respiratory: Negative.   Cardiovascular: Negative.   Gastrointestinal: Negative.   Genitourinary: Negative.   Musculoskeletal: Negative.   Skin: Negative.   Neurological: Positive for tremors.  Endo/Heme/Allergies: Negative.   Psychiatric/Behavioral: Positive for depression, substance abuse and suicidal ideas. The patient is nervous/anxious and has insomnia.     Blood pressure 113/73, pulse 83, temperature 98.2 F (36.8 C), temperature source Oral, resp. rate 18, SpO2 97 %.There is no height or weight on file to calculate BMI.  General Appearance: Disheveled  Eye Contact:  Good  Speech:  Clear and Coherent  Volume:  Decreased  Mood:  Depressed  Affect:  Constricted  Thought Process:  Coherent  Orientation:  Full (Time, Place, and Person)  Thought Content:  Logical  Suicidal Thoughts:  Yes.  with intent/plan  Homicidal Thoughts:  No  Memory:  Immediate;   Good Recent;   Good Remote;   Good  Judgement:  Impaired  Insight:  Shallow  Psychomotor Activity:  Psychomotor Retardation  Concentration:  Concentration: Fair and Attention Span: Fair  Recall:  Good  Fund of Knowledge:  Fair  Language:  Good  Akathisia:  No  Handed:  Right  AIMS (if indicated):     Assets:  Communication Skills Social Support  ADL's:  Intact  Cognition:  WNL  Sleep:   poor     Treatment Plan Summary: Daily contact with patient to assess and evaluate symptoms and progress in treatment and Medication management Continue Zoloft 100 mg daily and Gabapentin 200 mg bid for alcohol withdrawal Continue Alcohol withdrawal protocol. Disposition: Recommend psychiatric Inpatient admission when medically cleared.  Corena Pilgrim, MD 04/28/2017  11:29 AM

## 2017-04-29 MED ORDER — MUPIROCIN CALCIUM 2 % EX CREA
TOPICAL_CREAM | Freq: Two times a day (BID) | CUTANEOUS | Status: DC
  Administered 2017-04-30 – 2017-05-02 (×4): via TOPICAL
  Filled 2017-04-29 (×2): qty 15

## 2017-04-29 MED ORDER — AMOXICILLIN-POT CLAVULANATE 875-125 MG PO TABS
1.0000 | ORAL_TABLET | Freq: Two times a day (BID) | ORAL | Status: DC
  Administered 2017-04-29 – 2017-05-02 (×6): 1 via ORAL
  Filled 2017-04-29 (×6): qty 1
  Filled 2017-04-29 (×2): qty 4
  Filled 2017-04-29 (×3): qty 1

## 2017-04-29 NOTE — Tx Team (Addendum)
Initial Treatment Plan 04/29/2017 12:24 AM Benjamin Lindsey Nyman ZOX:[id number]    PATIENT STRESSORS: Marital or family conflict Substance abuse   PATIENT STRENGTHS: Average or above average intelligence Communication skills Physical Health Supportive family/friends   PATIENT IDENTIFIED PROBLEMS: At risk for suicide  Substance Abuse  "Get back to work"  "Never drink wine again"               DISCHARGE CRITERIA:  Ability to meet basic life and health needs Improved stabilization in mood, thinking, and/or behavior Motivation to continue treatment in Lindsey less acute level of care Need for constant or close observation no longer present Withdrawal symptoms are absent or subacute and managed without 24-hour nursing intervention  PRELIMINARY DISCHARGE PLAN: Attend 12-step recovery group Outpatient therapy Return to previous living arrangement Return to previous work or school arrangements  PATIENT/FAMILY INVOLVEMENT: This treatment plan has been presented to and reviewed with the patient, Benjamin Lindsey Outman.  The patient and family have been given the opportunity to ask questions and make suggestions.  Carleene OverlieMiddleton, Jessicaann Overbaugh P, RN 04/29/2017, 12:24 AM

## 2017-04-29 NOTE — Progress Notes (Signed)
Recreation Therapy Notes  Date:  04/29/17  Time: 1000 Location: 500 Hall Dayroom  Group Topic: Stress Management  Goal Area(s) Addresses:  Patient will verbalize importance of using healthy stress management.  Patient will identify positive emotions associated with healthy stress management.   Intervention: Stress Management  Activity :  Guided Imagery.  LRT introduced the stress management technique of guided imagery.  Patients were to listen and follow along as LRT read script for patients to embark on a mental vacation.  Education:  Stress Management, Discharge Planning.   Education Outcome: Acknowledges edcuation/In group clarification offered/Needs additional education  Clinical Observations/Feedback: Pt did not attend group.    Caroll RancherMarjette Jamiyla Ishee, LRT/CTRS         Caroll RancherLindsay, Cree Napoli A 04/29/2017 11:57 AM

## 2017-04-29 NOTE — H&P (Signed)
Psychiatric Admission Assessment Adult  Patient Identification: Benjamin Lindsey MRN:  [id number] Date of Evaluation:  04/29/2017 Chief Complaint:  SUBSTANCE INDUCED MOOD DISORDER  ALCOHOL USE DISORDER  Principal Diagnosis: MDD (major depressive disorder), recurrent severe, without psychosis ([place]) Diagnosis:   Patient Active Problem List   Diagnosis Date Noted  . Major depressive disorder, recurrent severe without psychotic features ([place]) [F33.2] 04/28/2017  . MDD (major depressive disorder), recurrent severe, without psychosis ([place]) [F33.2] 04/28/2017  . Alcohol dependence ([place]) [F10.20] 08/03/2013  . Benzodiazepine dependence ([place]) [F13.20] 08/03/2013  . GAD (generalized anxiety disorder) [F41.1] 08/03/2013   History of Present Illness:  04/27/17 ED [facility] Assessment: 36 y.o.single male, who was voluntarily brought into [facility]. Patient stated that he did not understand why he was brought to the ED. Patient reported consumption of wine, to relieve pain, due to recently suffering an injury on his shoulder.  Per medical labs, Patient's Ethanol level was 355 mg/dL at 1646.  Patient reported becoming angry with his father, resulting in him saying that he took several of his prescribed Klonopin.  Patient reported that he lied about the consumption of Klonopin, due to being angry and had no current or past suicidal ideations.   Per medical records 04/27/2017 ([name], RN), "He reportedly told his family that he took 42 of the 0.5 mg doses of Klonopin. Of note, the patient is prescribed 0.5 mg 7 times a day by his psychiatrist." UDS pending at time of assessment.  Patient denies suicidal ideations, homicidal ideations, auditory/visual hallucinations, self-injurious behaviors, or access to weapons.  Patient reported current experiences with depressive symptoms, such as fatigue and angery.   Patient reported currently residing with his parents.  Patient reported having an upcoming court date for the violation of  a restraining order completed by his father.  Patient stated that he issue current was resolved, enabling him to return back to his parent's residency.  Patient identified his father as a supportive factor for him.  Patient identified recent stressors associated with a stressful work environment.  Patient denies any history of physical, sexual, or verbal abuse. Patient reported no family history of suicide and substance abuse. Patient stated that he is currently seeing an outpatient psychiatrist, Dr. Lin Lindsey, for anxiety, who prescribed him the Klonopin.  Patient stated that he has been compliant with all medications.   During assessment, Patient was cooperative during assessment. Patient was dressed in scrubs and sitting on his bed.  Patient was oriented to time, person, location, however not the situation.  Patient's eye contact was fair.  Patient's motor activity consisted of restlessness and agitation.  Patient's speech was argumentative, loud, and slurred.  Patient's level of consciousness was sedated and irritable.  Patient's mood appeared to be anxious and irritable.  Patient's affect was irritable and angry.  Patient's anxiety level was moderate. Patient's thought process was circumstantial and relevant.  Patient's judgment appeared to be impaired.  Patient stated the he wanted to leave the ED and felt that he had been kidnapped and held against his will.  Patient presents today in a pleasant and cooperative mood. He admits the above information. He reports that once his dad knew about the alcohol he would kick him out, because his dad has done it before. He states that he told his dad that he overdosed so his dad would feel sympathetic and not kick him out, he did not expect to be IVC'd. He continues denying any SI/HI/AVH. He admits depression, but plans to continue his Zoloft.  He admits anxiety and plans to continue his Klonopin. He also reports that he only takes 1-2 Klonopin a day. He refuses detox  and refuses rehab. He is more concerned about being discharged so he can go back to work. 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 He admits to drinking excessively on his days off but not during the work days. He reports that he has had 2 DUIs in the past but still has his DL.    Associated Signs/Symptoms: Depression Symptoms:  anxiety, admits depression but not depressed currently (Hypo) Manic Symptoms:  Denies Anxiety Symptoms:  Excessive Worry, Panic Symptoms, Social Anxiety, Psychotic Symptoms:  Denies PTSD Symptoms: NA Total Time spent with patient: 45 minutes  Past Psychiatric History: MDD, ETOH abuse, Benzodiazapine dependence  Is the patient at risk to self? No.  Has the patient been a risk to self in the past 6 months? No.  Has the patient been a risk to self within the distant past? No.  Is the patient a risk to others? No.  Has the patient been a risk to others in the past 6 months? No.  Has the patient been a risk to others within the distant past? No.   Prior Inpatient Therapy:   Prior Outpatient Therapy:    Alcohol Screening: 1. How often do you have a drink containing alcohol?: 2 to 3 times a week 2. How many drinks containing alcohol do you have on a typical day when you are drinking?: 5 or 6 3. How often do you have six or more drinks on one occasion?: Weekly Preliminary Score: 5 4. How often during the last year have you found that you were not able to stop drinking once you had started?: Never 5. How often during the last year have you failed to do what was normally expected from you becasue of drinking?: Never 6. How often during the last year have you needed a first drink in the morning to get yourself going after a heavy drinking session?: Never 7. How often during the last year have you had a feeling of guilt of remorse after drinking?: Less than monthly 8. How often during the last year have you been unable to remember  what happened the night before because you had been drinking?: Never 9. Have you or someone else been injured as a result of your drinking?: No 10. Has a relative or friend or a doctor or another health worker been concerned about your drinking or suggested you cut down?: No Alcohol Use Disorder Identification Test Final Score (AUDIT): 9 Brief Intervention: Yes Substance Abuse History in the last 12 months:  Yes.   Consequences of Substance Abuse: Medical Consequences:  reviewed Legal Consequences:  reviewed Family Consequences:  reviewed Previous Psychotropic Medications: Yes  Psychological Evaluations: Yes  Past Medical History:  Past Medical History:  Diagnosis Date  . Alcohol abuse   . Anxiety   . Benzodiazepine dependence (HCC)     Past Surgical History:  Procedure Laterality Date  . ROTATOR CUFF REPAIR     Family History: History reviewed. No pertinent family history. Family Psychiatric  History: Denies  Tobacco Screening: Have you used any form of tobacco in the last 30 days? (Cigarettes, Smokeless Tobacco, Cigars, and/or Pipes): Yes Tobacco use, Select all that apply: 5 or more cigarettes per day Are you interested in Tobacco Cessation Medications?: No, patient refused Counseled patient on smoking cessation including recognizing danger situations, developing coping  skills and basic information about quitting provided: Refused/Declined practical counseling Social History:  History  Alcohol Use No    Comment: quit 02/09/17     History  Drug Use No    Additional Social History:                           Allergies:  No Known Allergies Lab Results:  Results for orders placed or performed during the hospital encounter of 04/27/17 (from the past 48 hour(s))  CBC with Differential     Status: Abnormal   Collection Time: 04/27/17  4:46 PM  Result Value Ref Range   WBC 7.4 4.0 - 10.5 K/uL   RBC 4.85 4.22 - 5.81 MIL/uL   Hemoglobin 14.1 13.0 - 17.0 g/dL   HCT  42.2 39.0 - 52.0 %   MCV 87.0 78.0 - 100.0 fL   MCH 29.1 26.0 - 34.0 pg   MCHC 33.4 30.0 - 36.0 g/dL   RDW 16.3 (H) 11.5 - 15.5 %   Platelets 295 150 - 400 K/uL   Neutrophils Relative % 40 %   Neutro Abs 2.9 1.7 - 7.7 K/uL   Lymphocytes Relative 49 %   Lymphs Abs 3.7 0.7 - 4.0 K/uL   Monocytes Relative 5 %   Monocytes Absolute 0.3 0.1 - 1.0 K/uL   Eosinophils Relative 5 %   Eosinophils Absolute 0.4 0.0 - 0.7 K/uL   Basophils Relative 1 %   Basophils Absolute 0.1 0.0 - 0.1 K/uL  Comprehensive metabolic panel     Status: Abnormal   Collection Time: 04/27/17  4:46 PM  Result Value Ref Range   Sodium 145 135 - 145 mmol/L   Potassium 3.9 3.5 - 5.1 mmol/L   Chloride 112 (H) 101 - 111 mmol/L   CO2 23 22 - 32 mmol/L   Glucose, Bld 91 65 - 99 mg/dL   BUN 15 6 - 20 mg/dL   Creatinine, Ser 0.84 0.61 - 1.24 mg/dL   Calcium 8.3 (L) 8.9 - 10.3 mg/dL   Total Protein 7.5 6.5 - 8.1 g/dL   Albumin 3.9 3.5 - 5.0 g/dL   AST 21 15 - 41 U/L   ALT 24 17 - 63 U/L   Alkaline Phosphatase 90 38 - 126 U/L   Total Bilirubin 0.4 0.3 - 1.2 mg/dL   GFR calc non Af Amer >60 >60 mL/min   GFR calc Af Amer >60 >60 mL/min    Comment: (NOTE) The eGFR has been calculated using the CKD EPI equation. This calculation has not been validated in all clinical situations. eGFR's persistently <60 mL/min signify possible Chronic Kidney Disease.    Anion gap 10 5 - 15  Ethanol     Status: Abnormal   Collection Time: 04/27/17  4:46 PM  Result Value Ref Range   Alcohol, Ethyl (B) 355 (HH) <10 mg/dL    Comment:        LOWEST DETECTABLE LIMIT FOR SERUM ALCOHOL IS 10 mg/dL FOR MEDICAL PURPOSES ONLY CRITICAL RESULT CALLED TO, READ BACK BY AND VERIFIED WITH: SMITH,T. RN '@[id number]'$  ON 10.17.18 BY COHEN,K   Lipase, blood     Status: Abnormal   Collection Time: 04/27/17  4:46 PM  Result Value Ref Range   Lipase 72 (H) 11 - 51 U/L  Acetaminophen level     Status: Abnormal   Collection Time: 04/27/17  4:46 PM  Result Value  Ref Range   Acetaminophen (Tylenol), Serum <10 (L)  10 - 30 ug/mL    Comment:        THERAPEUTIC CONCENTRATIONS VARY SIGNIFICANTLY. A RANGE OF 10-30 ug/mL MAY BE AN EFFECTIVE CONCENTRATION FOR MANY PATIENTS. HOWEVER, SOME ARE BEST TREATED AT CONCENTRATIONS OUTSIDE THIS RANGE. ACETAMINOPHEN CONCENTRATIONS >150 ug/mL AT 4 HOURS AFTER INGESTION AND >50 ug/mL AT 12 HOURS AFTER INGESTION ARE OFTEN ASSOCIATED WITH TOXIC REACTIONS.   Salicylate level     Status: None   Collection Time: 04/27/17  4:46 PM  Result Value Ref Range   Salicylate Lvl <7.0 2.8 - 30.0 mg/dL  Rapid urine drug screen (hospital performed)     Status: None   Collection Time: 04/27/17  7:58 PM  Result Value Ref Range   Opiates NONE DETECTED NONE DETECTED   Cocaine NONE DETECTED NONE DETECTED   Benzodiazepines NONE DETECTED NONE DETECTED   Amphetamines NONE DETECTED NONE DETECTED   Tetrahydrocannabinol NONE DETECTED NONE DETECTED   Barbiturates NONE DETECTED NONE DETECTED    Comment:        DRUG SCREEN FOR MEDICAL PURPOSES ONLY.  IF CONFIRMATION IS NEEDED FOR ANY PURPOSE, NOTIFY LAB WITHIN 5 DAYS.        LOWEST DETECTABLE LIMITS FOR URINE DRUG SCREEN Drug Class       Cutoff (ng/mL) Amphetamine      1000 Barbiturate      200 Benzodiazepine   200 Tricyclics       300 Opiates          300 Cocaine          300 THC              50     Blood Alcohol level:  Lab Results  Component Value Date   ETH 355 (HH) 04/27/2017   ETH 249 (H) 01/06/2017    Metabolic Disorder Labs:  No results found for: HGBA1C, MPG No results found for: PROLACTIN No results found for: CHOL, TRIG, HDL, CHOLHDL, VLDL, LDLCALC  Current Medications: Current Facility-Administered Medications  Medication Dose Route Frequency Provider Last Rate Last Dose  . acetaminophen (TYLENOL) tablet 650 mg  650 mg Oral Q6H PRN Laveda Abbe, NP      . alum & mag hydroxide-simeth (MAALOX/MYLANTA) 200-200-20 MG/5ML suspension 30 mL  30 mL  Oral Q4H PRN Laveda Abbe, NP      . chlordiazePOXIDE (LIBRIUM) capsule 25 mg  25 mg Oral Q6H PRN Nira Conn A, NP      . chlordiazePOXIDE (LIBRIUM) capsule 25 mg  25 mg Oral QID Nira Conn A, NP   25 mg at 04/29/17 0839   Followed by  . [START ON 04/30/2017] chlordiazePOXIDE (LIBRIUM) capsule 25 mg  25 mg Oral TID Jackelyn Poling, NP       Followed by  . [START ON 05/01/2017] chlordiazePOXIDE (LIBRIUM) capsule 25 mg  25 mg Oral BH-qamhs Jackelyn Poling, NP       Followed by  . [START ON 05/02/2017] chlordiazePOXIDE (LIBRIUM) capsule 25 mg  25 mg Oral Daily Nira Conn A, NP      . gabapentin (NEURONTIN) capsule 200 mg  200 mg Oral BID Laveda Abbe, NP   200 mg at 04/29/17 1879  . hydrOXYzine (ATARAX/VISTARIL) tablet 25 mg  25 mg Oral TID PRN Laveda Abbe, NP   25 mg at 04/28/17 2256  . Influenza vac split quadrivalent PF (FLUARIX) injection 0.5 mL  0.5 mL Intramuscular Tomorrow-1000 Nira Conn A, NP      . loperamide (IMODIUM)  capsule 2-4 mg  2-4 mg Oral PRN Lindon Romp A, NP      . magnesium hydroxide (MILK OF MAGNESIA) suspension 30 mL  30 mL Oral Daily PRN Ethelene Hal, NP      . ondansetron (ZOFRAN-ODT) disintegrating tablet 4 mg  4 mg Oral Q6H PRN Lindon Romp A, NP      . pneumococcal 23 valent vaccine (PNU-IMMUNE) injection 0.5 mL  0.5 mL Intramuscular Tomorrow-1000 Lindon Romp A, NP      . sertraline (ZOLOFT) tablet 100 mg  100 mg Oral Daily Ethelene Hal, NP   100 mg at 04/29/17 0839  . thiamine (VITAMIN B-1) tablet 100 mg  100 mg Oral Daily Lindon Romp A, NP   100 mg at 04/29/17 0839  . traZODone (DESYREL) tablet 50 mg  50 mg Oral QHS PRN Ethelene Hal, NP   50 mg at 04/28/17 2256   PTA Medications: Prescriptions Prior to Admission  Medication Sig Dispense Refill Last Dose  . acetaminophen (TYLENOL) 325 MG tablet Take 650 mg by mouth daily as needed for mild pain.   Past Week at Unknown time  . clonazePAM (KLONOPIN) 0.5 MG  tablet Take 0.5 mg by mouth 4 (four) times daily.   04/27/2017 at Unknown time  . hydrOXYzine (ATARAX/VISTARIL) 25 MG tablet Take 1 tablet (25 mg total) by mouth every 6 (six) hours as needed for anxiety (or CIWA score </= 10). (Patient not taking: Reported on 04/27/2017) 30 tablet 0 Completed Course at Unknown time  . methocarbamol (ROBAXIN) 500 MG tablet Take 1 tablet (500 mg total) by mouth 2 (two) times daily. (Patient not taking: Reported on 02/15/2017) 10 tablet 0 Completed Course at Unknown time  . sertraline (ZOLOFT) 50 MG tablet Take 100 mg by mouth daily.    04/27/2017 at Unknown time  . Tetrahydrozoline HCl (VISINE OP) Place 1 drop into both eyes daily as needed (REDNESS).   04/26/2017 at Unknown time    Musculoskeletal: Strength & Muscle Tone: within normal limits Gait & Station: normal Patient leans: N/A  Psychiatric Specialty Exam: Physical Exam  Nursing note and vitals reviewed. Constitutional: He is oriented to person, place, and time. He appears well-developed and well-nourished.  Cardiovascular: Normal rate.   Respiratory: Effort normal.  Musculoskeletal: Normal range of motion.  Neurological: He is alert and oriented to person, place, and time.  Skin: Skin is warm.    Review of Systems  Constitutional: Negative.   HENT: Negative.   Eyes: Negative.   Respiratory: Negative.   Cardiovascular: Negative.   Gastrointestinal: Negative.   Genitourinary: Negative.   Musculoskeletal: Negative.   Skin: Negative.   Neurological: Negative.   Endo/Heme/Allergies: Negative.   Psychiatric/Behavioral: Positive for depression. Negative for hallucinations and suicidal ideas. The patient is nervous/anxious.     Blood pressure (!) 167/139, pulse 89, temperature 98.7 F (37.1 C), temperature source Oral, resp. rate 16, height '5\' 11"'$  (1.803 m), weight 95.3 kg (210 lb).Body mass index is 29.29 kg/m.  General Appearance: Casual  Eye Contact:  Good  Speech:  Clear and Coherent and  Normal Rate  Volume:  Normal  Mood:  Anxious  Affect:  Congruent  Thought Process:  Goal Directed and Descriptions of Associations: Intact  Orientation:  Full (Time, Place, and Person)  Thought Content:  WDL  Suicidal Thoughts:  No  Homicidal Thoughts:  No  Memory:  Immediate;   Good Recent;   Good  Judgement:  Fair  Insight:  Fair  Psychomotor Activity:  Normal  Concentration:  Concentration: Good and Attention Span: Good  Recall:  Good  Fund of Knowledge:  Good  Language:  Good  Akathisia:  No  Handed:  Right  AIMS (if indicated):     Assets:  Communication Skills Desire for Improvement Financial Resources/Insurance Housing Social Support Transportation  ADL's:  Intact  Cognition:  WNL  Sleep:  Number of Hours: 6.5    Treatment Plan Summary: Daily contact with patient to assess and evaluate symptoms and progress in treatment, Medication management and Plan is to:  -See SRA and MAR for medication management -Encourage group therapy participation  Observation Level/Precautions:  15 minute checks  Laboratory:  Reviewed  Psychotherapy:  Group therapy  Medications:  See [facility]  Consultations:  As needed  Discharge Concerns:  Compliance, relapse  Estimated LOS: 3-5 Days  Other:  Admit to [facility] for Primary Diagnosis: MDD (major depressive disorder), recurrent severe, without psychosis ([place]) Long Term Goal(s): Improvement in symptoms so as ready for discharge  Short Term Goals: Ability to verbalize feelings will improve and Ability to disclose and discuss suicidal ideas  Physician Treatment Plan for Secondary Diagnosis: Principal Problem:   MDD (major depressive disorder), recurrent severe, without psychosis ([place]) Active Problems:   Alcohol dependence ([place])   Benzodiazepine dependence ([place])   GAD (generalized anxiety disorder)  Long Term Goal(s): Improvement in symptoms so as ready for discharge  Short Term Goals: Ability to demonstrate  self-control will improve, Ability to identify and develop effective coping behaviors will improve, Ability to maintain clinical measurements within normal limits will improve and Compliance with prescribed medications will improve  I certify that inpatient services furnished can reasonably be expected to improve the patient's condition.    Lewis Shock, FNP 10/19/201812:36 PM   I have discussed case with NP and have met with patient  Agree with NP note and assessment  36 year old male, single, employed, lives with father. States that his father disapproves strongly of him consuming any alcohol, and that father recently " caught him" drinking, resulting in argument. Patient told father he had overdosed on Klonopin, which led to him being brought to ED, but at this time states " I never really overdosed, I did not take any Klonopin". States he told his father this because he did not want father to kick him out of the house and states he did not think making statement would result in psychiatric admission. At this time patient minimizes recent depression or neuro-vegetative symptoms and denies any suicidal ideations. He minimizes alcohol use , states " I drink 1-2 beers a couple of times a week on my days off". He answers 0/4 CAGE questions affirmatively. He does have a history of 2 prior DUIs, last one 2 years ago, a withdrawal seizure , which he states was related to stopping Klonopin , and admission BAL was 355. Prior BAL on 01/06/17 was 249.   At this time patient denies symptoms of alcohol WDL, has no distal tremors, vitals are stable- he does present with some facial flushing. Patient reports he is prescribed Zoloft and Klonopin / has been taking Klonopin daily, 2-3 x per day ( 0.5 mgr tablets). Admission UDS was negative.   Dx- Alcohol Use Disorder, Alcohol Induced Mood Disorder   Plan- inpatient admission- [facility] detox protocol to minimize risk of WDL. Continue Zoloft 100 mgr QDAY

## 2017-04-29 NOTE — [facility] Counselor (Signed)
2 attempts made to complete psychosocial assessment. Pt sleeping both times. Unable to complete PSA today.  Trula SladeHeather Smart, MSW, LCSW Clinical Social Worker 04/29/2017 3:00 PM

## 2017-04-29 NOTE — [facility] Group Notes (Signed)
LCSW Group Therapy Note  04/29/2017 1:15pm  Type of Therapy and Topic:  Group Therapy:  Feelings around Relapse and Recovery  Participation Level:  Did Not Attend-pt invited. Chose to remain in bed.    Description of Group:    Patients in this group will discuss emotions they experience before and after a relapse. They will process how experiencing these feelings, or avoidance of experiencing them, relates to having a relapse. Facilitator will guide patients to explore emotions they have related to recovery. Patients will be encouraged to process which emotions are more powerful. They will be guided to discuss the emotional reaction significant others in their lives may have to their relapse or recovery. Patients will be assisted in exploring ways to respond to the emotions of others without this contributing to a relapse.  Therapeutic Goals: 1. Patient will identify two or more emotions that lead to a relapse for them 2. Patient will identify two emotions that result when they relapse 3. Patient will identify two emotions related to recovery 4. Patient will demonstrate ability to communicate their needs through discussion and/or role plays   Summary of Patient Progress:     Therapeutic Modalities:   Cognitive Behavioral Therapy Solution-Focused Therapy Assertiveness Training Relapse Prevention Therapy   Daylen Lipsky N Smart, LCSW 04/29/2017 2:24 PM  

## 2017-04-29 NOTE — Tx Team (Signed)
Interdisciplinary Treatment and Diagnostic Plan Update  04/29/2017 Time of Session: 0830AM Benjamin Lindsey MRN: [id number]  Principal Diagnosis: MDD recurrent, severe, without psychosis  Secondary Diagnoses: Active Problems:   MDD (major depressive disorder), recurrent severe, without psychosis ([place])   Current Medications:  Current Facility-Administered Medications  Medication Dose Route Frequency Provider Last Rate Last Dose  . acetaminophen (TYLENOL) tablet 650 mg  650 mg Oral Q6H PRN Ethelene Hal, NP      . alum & mag hydroxide-simeth (MAALOX/MYLANTA) 200-200-20 MG/5ML suspension 30 mL  30 mL Oral Q4H PRN Ethelene Hal, NP      . chlordiazePOXIDE (LIBRIUM) capsule 25 mg  25 mg Oral Q6H PRN Lindon Romp A, NP      . chlordiazePOXIDE (LIBRIUM) capsule 25 mg  25 mg Oral QID Lindon Romp A, NP   25 mg at 04/29/17 0839   Followed by  . [START ON 04/30/2017] chlordiazePOXIDE (LIBRIUM) capsule 25 mg  25 mg Oral TID Rozetta Nunnery, NP       Followed by  . [START ON 05/01/2017] chlordiazePOXIDE (LIBRIUM) capsule 25 mg  25 mg Oral BH-qamhs Rozetta Nunnery, NP       Followed by  . [START ON 05/02/2017] chlordiazePOXIDE (LIBRIUM) capsule 25 mg  25 mg Oral Daily Lindon Romp A, NP      . gabapentin (NEURONTIN) capsule 200 mg  200 mg Oral BID Ethelene Hal, NP   200 mg at 04/29/17 4193  . hydrOXYzine (ATARAX/VISTARIL) tablet 25 mg  25 mg Oral TID PRN Ethelene Hal, NP   25 mg at 04/28/17 2256  . Influenza vac split quadrivalent PF (FLUARIX) injection 0.5 mL  0.5 mL Intramuscular Tomorrow-1000 Lindon Romp A, NP      . loperamide (IMODIUM) capsule 2-4 mg  2-4 mg Oral PRN Lindon Romp A, NP      . magnesium hydroxide (MILK OF MAGNESIA) suspension 30 mL  30 mL Oral Daily PRN Ethelene Hal, NP      . ondansetron (ZOFRAN-ODT) disintegrating tablet 4 mg  4 mg Oral Q6H PRN Lindon Romp A, NP      . pneumococcal 23 valent vaccine (PNU-IMMUNE) injection 0.5 mL  0.5  mL Intramuscular Tomorrow-1000 Lindon Romp A, NP      . sertraline (ZOLOFT) tablet 100 mg  100 mg Oral Daily Ethelene Hal, NP   100 mg at 04/29/17 0839  . thiamine (VITAMIN B-1) tablet 100 mg  100 mg Oral Daily Lindon Romp A, NP   100 mg at 04/29/17 0839  . traZODone (DESYREL) tablet 50 mg  50 mg Oral QHS PRN Ethelene Hal, NP   50 mg at 04/28/17 2256   PTA Medications: Prescriptions Prior to Admission  Medication Sig Dispense Refill Last Dose  . acetaminophen (TYLENOL) 325 MG tablet Take 650 mg by mouth daily as needed for mild pain.   Past Week at Unknown time  . clonazePAM (KLONOPIN) 0.5 MG tablet Take 0.5 mg by mouth 4 (four) times daily.   04/27/2017 at Unknown time  . hydrOXYzine (ATARAX/VISTARIL) 25 MG tablet Take 1 tablet (25 mg total) by mouth every 6 (six) hours as needed for anxiety (or CIWA score </= 10). (Patient not taking: Reported on 04/27/2017) 30 tablet 0 Completed Course at Unknown time  . methocarbamol (ROBAXIN) 500 MG tablet Take 1 tablet (500 mg total) by mouth 2 (two) times daily. (Patient not taking: Reported on 02/15/2017) 10 tablet 0 Completed Course at Unknown time  .  sertraline (ZOLOFT) 50 MG tablet Take 100 mg by mouth daily.    04/27/2017 at Unknown time  . Tetrahydrozoline HCl (VISINE OP) Place 1 drop into both eyes daily as needed (REDNESS).   04/26/2017 at Unknown time    Patient Stressors: Marital or family conflict Substance abuse  Patient Strengths: Average or above average intelligence Communication skills Physical Health Supportive family/friends  Treatment Modalities: Medication Management, Group therapy, Case management,  1 to 1 session with clinician, Psychoeducation, Recreational therapy.   Physician Treatment Plan for Primary Diagnosis:MDD recurrent, severe, without psychosis  Medication Management: Evaluate patient's response, side effects, and tolerance of medication regimen.  Therapeutic Interventions: 1 to 1 sessions,  Unit Group sessions and Medication administration.  Evaluation of Outcomes: Not Met  Physician Treatment Plan for Secondary Diagnosis: Active Problems:   MDD (major depressive disorder), recurrent severe, without psychosis ([place])  Long Term Goal(s):     Short Term Goals:       Medication Management: Evaluate patient's response, side effects, and tolerance of medication regimen.  Therapeutic Interventions: 1 to 1 sessions, Unit Group sessions and Medication administration.  Evaluation of Outcomes: Not Met   RN Treatment Plan for Primary Diagnosis: MDD recurrent, severe, without psychosis Long Term Goal(s): Knowledge of disease and therapeutic regimen to maintain health will improve  Short Term Goals: Ability to remain free from injury will improve, Ability to demonstrate self-control and Ability to disclose and discuss suicidal ideas  Medication Management: RN will administer medications as ordered by provider, will assess and evaluate patient's response and provide education to patient for prescribed medication. RN will report any adverse and/or side effects to prescribing provider.  Therapeutic Interventions: 1 on 1 counseling sessions, Psychoeducation, Medication administration, Evaluate responses to treatment, Monitor vital signs and CBGs as ordered, Perform/monitor CIWA, COWS, AIMS and Fall Risk screenings as ordered, Perform wound care treatments as ordered.  Evaluation of Outcomes: Not Met   LCSW Treatment Plan for Primary Diagnosis: MDD recurrent, severe, without psychosis Long Term Goal(s): Safe transition to appropriate next level of care at discharge, Engage patient in therapeutic group addressing interpersonal concerns.  Short Term Goals: Engage patient in aftercare planning with referrals and resources, Facilitate patient progression through stages of change regarding substance use diagnoses and concerns and Identify triggers associated with mental health/substance abuse  issues  Therapeutic Interventions: Assess for all discharge needs, 1 to 1 time with Social worker, Explore available resources and support systems, Assess for adequacy in community support network, Educate family and significant other(s) on suicide prevention, Complete Psychosocial Assessment, Interpersonal group therapy.  Evaluation of Outcomes: Not Met   Progress in Treatment: Attending groups: No. Patient new to unit. Continuing to assess.  Participating in groups: No. Taking medication as prescribed: Yes. Toleration medication: Yes. Family/Significant other contact made: No, will contact:  family member if patient consents Patient understands diagnosis: Yes. Discussing patient identified problems/goals with staff: Yes. Medical problems stabilized or resolved: Yes. Denies suicidal/homicidal ideation: Yes. Issues/concerns per patient self-inventory: No. Other: n/a   New problem(s) identified: No, Describe:  n/a  New Short Term/Long Term Goal(s): alcohol detox; medication management for mood stabilization; elimination of SI thoughts; development of comprehensive mental wellness/sobriety plan.   Discharge Plan or Barriers: CSW assessing for appropriate referrals. Pt sees Dr. Reece Levy at [facility] for medication management. Last admission in 08/02/13, pt was sent to [facility] for residential treatment. He currently lives with his parents.   Reason for Continuation of Hospitalization: Anxiety Depression Medication stabilization  Withdrawal symptoms  Estimated Length of Stay: Tuesday, 05/03/17  Attendees: Patient: 04/29/2017 10:32 AM  Physician: Dr. Nancy Fetter MD; Dr. Parke Poisson MD 04/29/2017 10:32 AM  Nursing: Nira Conn RN 04/29/2017 10:32 AM  RN Care Manager: Lars Pinks CM 04/29/2017 10:32 AM  Social Worker: Maxie Better, LCSW 04/29/2017 10:32 AM  Recreational Therapist: x 04/29/2017 10:32 AM  Other: Lindell Spar NP; Darnelle Maffucci Money NP 04/29/2017 10:32 AM  Other:   04/29/2017 10:32 AM  Other: 04/29/2017 10:32 AM    Scribe for Treatment Team: [place], LCSW 04/29/2017 10:32 AM

## 2017-04-29 NOTE — [facility] Suicide Risk Assessment (Signed)
[facility]   Nursing information obtained from:  Patient Demographic factors:  Male, Caucasian Current Mental Status:  Suicidal ideation indicated by patient Loss Factors:  NA Historical Factors:  NA Risk Reduction Factors:  Living with another person, especially a relative, Positive social support  Total Time spent with patient: 45 minutes Principal Problem: MDD (major depressive disorder), recurrent severe, without psychosis (H[facility]) Diagnosis:   Patient Active Problem List   Diagnosis Date Noted  . Major depressive disorder, recurrent severe without psychotic features (HCC) [F33.2] 04/28/2017  . MDD (major depressive disorder), recurrent severe, without psychosis (HCC) [F33.2] 04/28/2017  . Alcohol dependence (HCC) [F10.20] 08/03/2013  . Benzodiazepine dependence (HCC) [F13.20] 08/03/2013  . GAD (generalized anxiety disorder) [F41.1] 08/03/2013    Continued Clinical Symptoms:  Alcohol Use Disorder Identification Test Final Score (AUDIT): 9 The "Alcohol Use Disorders Identification Test", Guidelines for Use in Primary Care, Second Edition.  World Science writerHealth Organization [facility](WHO). Score between 0-7:  no or low risk or alcohol related problems. Score between 8-15:  moderate risk of alcohol related problems. Score between 16-19:  high risk of alcohol related problems. Score 20 or above:  warrants further diagnostic evaluation for alcohol dependence and treatment.   CLINICAL FACTORS:  36 year old male, single, employed, lives with father. States that his father disapproves strongly of him consuming any alcohol, and that father recently " caught him" drinking, resulting in argument. Patient told father he had overdosed on Klonopin, which led to him being brought to ED, but at this time states " I never really overdosed, I did not take any Klonopin". States he told his father this because he did not want father to kick him out of the house and states he did not think  making statement would result in psychiatric admission. At this time patient minimizes recent depression or neuro-vegetative symptoms and denies any suicidal ideations. He minimizes alcohol use , states " I drink 1-2 beers a couple of times a week on my days off". He answers 0/4 CAGE questions affirmatively. He does have a history of 2 prior DUIs, last one 2 years ago, a withdrawal seizure , which he states was related to stopping Klonopin , and admission BAL was 355. Prior BAL on 01/06/17 was 249.   At this time patient denies symptoms of alcohol WDL, has no distal tremors, vitals are stable- he does present with some facial flushing. Patient reports he is prescribed Zoloft and Klonopin / has been taking Klonopin daily, 2-3 x per day ( 0.5 mgr tablets). Admission UDS was negative.   Dx- Alcohol Use Disorder, Alcohol Induced Mood Disorder   Plan- inpatient admission- Librium detox protocol to minimize risk of WDL. Continue Zoloft 100 mgr QDAY    Musculoskeletal: Strength & Muscle Tone: within normal limits no distal tremors, no diaphoresis Gait & Station: normal Patient leans: N/A  Psychiatric Specialty Exam: Physical Exam  ROS denies headache, denies visual disturbances, no vomiting, no fever or chills   Blood pressure (!) 134/94, pulse 69, temperature 98.7 F (37.1 C), temperature source Oral, resp. rate 16, height 5\' 11"  (1.803 m), weight 95.3 kg (210 lb).Body mass index is 29.29 kg/m.  General Appearance: Fairly Groomed  Eye Contact:  Good  Speech:  Normal Rate  Volume:  Normal  Mood:  denies depression, presents vaguely dysphoric   Affect:  Appropriate, vaguely anxious  Thought Process:  Linear and Descriptions of Associations: Intact  Orientation:  Full (Time, Place, and Person)  Thought Content:  denies hallucinations, no delusions, not internally preoccupied   Suicidal Thoughts:  No denies suicidal or self injurious ideations, denies homicidal or violent ideations   Homicidal  Thoughts:  No  Memory:  recent and remote grossly intact   Judgement:  Fair  Insight:  Fair  Psychomotor Activity:  Normal  Concentration:  Concentration: Good and Attention Span: Good  Recall:  Good  Fund of Knowledge:  Good  Language:  Good  Akathisia:  Negative  Handed:  Right  AIMS (if indicated):     Assets:  Communication Skills Desire for Improvement Resilience  ADL's:  Intact  Cognition:  WNL  Sleep:  Number of Hours: 6.5      COGNITIVE FEATURES THAT CONTRIBUTE TO RISK:  Closed-mindedness and Loss of executive function    SUICIDE RISK:   Mild:  Suicidal ideation of limited frequency, intensity, duration, and specificity.  There are no identifiable plans, no associated intent, mild dysphoria and related symptoms, good self-control (both objective and subjective assessment), few other risk factors, and identifiable protective factors, including available and accessible social support.  PLAN OF CARE: Patient will be admitted to inpatient psychiatric unit for stabilization and safety. Will provide and encourage milieu participation. Provide medication management and maked adjustments as needed.  Will also provide medication management to minimize risk of WDL.  Will follow daily.    I certify that inpatient services furnished can reasonably be expected to improve the patient's condition.   Craige Cotta, MD 04/29/2017, 3:55 PM

## 2017-04-29 NOTE — Progress Notes (Signed)
D Casimiro NeedleMichael is OOB UAL on the 300 hall today..he tolerates this well. HE is quiet. HE keeps to himslef but is seen socializing in the dayroom. He attends his meals in the cafe. A HE completed his daily assessment and on this he wrote  He deneid SI today and he rated his depression, hopelessness and anxiety " 0/0/3", respectively R Safety in place.

## 2017-04-30 DIAGNOSIS — F39 Unspecified mood [affective] disorder: Secondary | ICD-10-CM

## 2017-04-30 DIAGNOSIS — Z569 Unspecified problems related to employment: Secondary | ICD-10-CM

## 2017-04-30 DIAGNOSIS — F411 Generalized anxiety disorder: Secondary | ICD-10-CM

## 2017-04-30 DIAGNOSIS — F1320 Sedative, hypnotic or anxiolytic dependence, uncomplicated: Secondary | ICD-10-CM

## 2017-04-30 DIAGNOSIS — F1020 Alcohol dependence, uncomplicated: Secondary | ICD-10-CM

## 2017-04-30 NOTE — [facility] Counselor (Signed)
Adult Comprehensive Assessment  Patient ID: Benjamin Lindsey, male   DOB: May 05, 1981, 36 y.o.   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 [place] and I didnt want itEngineer, petroleum / Lack of resources (include bankruptcy): income from job Housing / Lack of housing: lives w parents while working on his credit score so he can get his own apartment Physical health (include injuries & life threatening diseases): dog bit on thumb Social relationships: has friends from work, girlfriend Substance abuse: "I only drink on my days off", it has caused conflict w father who is opposed to all alcohol use, "when he catches me drinking, he takes away my debit card, my car keys and wont let me have anyone over" Bereavement / Loss: no issues related  Living/Environment/Situation:  Living Arrangements: Parent Living conditions (as described by patient or guardian): lives w parents How long has patient lived in current situation?: several months, cannot rent own apartment currently, frustrated w restrictions put on him by father What is atmosphere in current home: Temporary  Family History:  Marital status: Single Are you sexually active?: Yes What is your sexual orientation?: heterosexual Has your sexual activity been affected by drugs, alcohol, medication, or emotional stress?: no Does patient have children?: Yes How many children?: 1 How is patient's relationship with their children?: I have an63 year old daughter. I see her three times a year on holidays and talk to her on the phone alot. She lives in [place] w her  mother.  Childhood History:  By whom was/is the patient raised?: Both parents Additional childhood history information: My father worked Theme park manager so I didn't see him much. Both my parents drank and my mother was addicted to cocaine for awhile. I had pretty good childhood overall though.  Description of patient's relationship with caregiver when they were a child: Close to mother; strained with father due to his absence due to work.  Father had heart attack, is now "health nut", wont allow alcohol in the house, frequent conflicts w patient who wants to drink Patient's description of current relationship with people who raised him/her: arguments w father, controlling; "fine" w mother How were you disciplined when you got in trouble as a child/adolescent?: belt Does patient have siblings?: Yes Number of Siblings: 2 Description of patient's current relationship with siblings: Older brother and older sister. Close to both siblings, "especially my brother. we are 13 months apart."  Did patient suffer any verbal/emotional/physical/sexual abuse as a child?: No Did patient suffer from severe childhood neglect?: No Has patient ever been sexually abused/assaulted/raped as an adolescent or adult?: No Was the patient ever a victim of a crime or a disaster?: No Witnessed domestic violence?: No Has patient been effected by domestic violence as an adult?: No  Education:  Highest grade of school patient has completed: two years at [employer] Currently a student?: No Name of school: N/A Learning disability?: No  Employment/Work Situation:   Employment situation: Employed 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  Where was the patient employed at that time?: 10 years  Has patient ever been in the Eli Lilly and Companymilitary?: No Has patient ever served in combat?: No Did You Receive Any Psychiatric Treatment/Services While in Equities traderthe Military?: No Are There Guns or Other Weapons in Your Home?: No  Financial Resources:   Financial resources: Income from employment (will be getting insurance at work soon, does not know carrier) Does patient have a Lawyerrepresentative payee or guardian?: No  Alcohol/Substance Abuse:   What has been your use of drugs/alcohol within the last 12 months?: drinks beer and wine on his days off, does not feel its  problem If attempted suicide, did drugs/alcohol play a role in this?: No Alcohol/Substance Abuse Treatment Hx: Past Tx, Inpatient If yes, describe treatment: [facility] and then [facility] - "I didnt want to be there"; doesnt feel alcohol is a problem Has alcohol/substance abuse ever caused legal problems?: No  Social Support System:   Conservation officer, natureatient's Community Support System: Production assistant, radioGood Describe Community Support System: co workers he can hang out with; girlfriend Type of faith/religion: agnostic How does patient's faith help to cope with current illness?: na  Leisure/Recreation:   Leisure and Hobbies: football, golf, pool, mountain biking  Strengths/Needs:   What things does the patient do well?: hard worker, consistent and dedicated In what areas does patient struggle / problems for patient: wants his own place, cannot see girlfriend because she is disabled and lives w her mother who restricts visitation  Discharge Plan:   Does patient have access to transportation?: Yes Will patient be returning to same living situation after discharge?: Yes Currently receiving community mental health services: Yes (From Whom) (Dr Betti Cruzeddy, [facility]) Does patient have financial barriers related to discharge medications?: No  Summary/Recommendations:   Summary  and Recommendations (to be completed by the evaluator): Charlyne Momtient is a 36 year old male, admitted voluntarily after drinking and threatening to take an overdose of prescribed Klonopin, diagnosed w Major Depressive Disorder.  Lives w parents, wants to get his own apartment.  Employed full time, works night shift.  Current w Dr Betti Cruzeddy for medications management for anxiety.  [facility].  Goal for hospitalization is to discharge quickly so he can return to work, worries that he will lose his job due to missing work.    Sallee LangeAnne C Cunningham. 04/30/2017

## 2017-04-30 NOTE — Progress Notes (Signed)
Writer has observed patient lying in bed asleep since shift change. Writer had to wake patient up in order for him to take his scheduled medications for tonight. He c/o left thumb pain from being bitten by his girlfriends small dog. He received tylenol for his pain and an order for an antibiotic to be start. He denied si/hi/a/v hallucinations. He returned to his room to lie back down. Safety maintained on unit with 15 min checks.

## 2017-04-30 NOTE — Progress Notes (Addendum)
Benjamin Lindsey is seen on the 300 hall this morning. He makes direct eye contact with Clinical research associatewriter. He reports he slept "ok". He takes his scheduled meds as planned and  Completes his daily assesment and on this he wrote he denied SI today and he rated his depression, hopelessness and anxeity "0/0/0/", respectively. A HE is seen sitting in the 300 dayroom frequently but does not socialize with other patients.He denies discomfort, pain and /or need for prn medication. His left thumb ( where he reported to this staff member yesterday ) he received a dogbite prior to his admission here.  R Safety is in place and poc cont.

## 2017-04-30 NOTE — [facility] Suicide Risk Assessment (Signed)
[facility] INPATIENT:  Family/Significant Other Suicide Prevention Education  Suicide Prevention Education:  Education Completed; Stacie AcresDebra Maese, mother, [phone],  (name of family member/significant other) has been identified by the patient as the family member/significant other with whom the patient will be residing, and identified as the person(s) who will aid the patient in the event of a mental health crisis (suicidal ideations/suicide attempt).  With written consent from the patient, the family member/significant other has been provided the following suicide prevention education, prior to the and/or following the discharge of the patient.  The suicide prevention education provided includes the following:  Suicide risk factors  Suicide prevention and interventions  National Suicide Hotline telephone number  [facility] assessment telephone number  [facility] 911  [facility] and/or Residential Mobile Crisis Unit telephone number  Request made of family/significant other to:  Remove weapons (e.g., guns, rifles, knives), all items previously/currently identified as safety concern.    Remove drugs/medications (over-the-counter, prescriptions, illicit drugs), all items previously/currently identified as a safety concern.  The family member/significant other verbalizes understanding of the suicide prevention education information provided.  The family member/significant other agrees to remove the items of safety concern listed above.  Lives w parents who don't allow alcohol in the home; "we threw him out two weeks ago and told him he couldn't come back until he went AA.  Patient resists the idea of being an alcoholic, "when the wind blows wrong, he starts to drink, he's very depressed, unhappy w his job/living situation/girlfriend"; "he doesn't do the things he used to do because they all involve alcohol." Parents would like patient to become involved  with peers who are sober.  "Every time he drinks, he drinks so much he gets himself in trouble, each time it's something different."  "Im drowning over here, I dont know how to help."  He's mentioned suicide before per mother, does not specify.  No access to guns/weapons.  Encouraged mother to consider attending [facility]; mother has 18 years "clean" so "I sort of know what he's going through."  Reviewed aftercare, mother will work on rescheduling appointment w [facility].  [facility] a condition of returning to live w parents.  Sallee Langenne C Norma Montemurro 04/30/2017, 5:40 PM

## 2017-04-30 NOTE — [facility] Group Notes (Signed)
LCSW Group Therapy Note  04/30/2017     10:00-11:00AM  Type of Therapy and Topic:  Group Therapy:  Decisional Balance/Substance Use  Participation Level:  Active        . Description of Group:  The main focus of today's process group was learning how to use a decisional balance exercise to make a decision about whether to change an unhealthy coping skill, as well as how to use the information gathered in the actual process of planning that change.  Patients listed some of their most frequently utilized unhealthy coping techniques and CSW pointed out the similarities.  Motivational Interviewing and the whiteboard were utilized to help patients explore in-depth the perceived benefits and costs of a specific, shared unhealthy coping technique (drinking & drugging) as well as the benefits and costs of replacing that with other, healthy coping skills.  A handout was distributed for patients to be able to do this exercise for themselves.     Therapeutic Goals 1. Patient will be able to utilize the decision balance exercise on their own 2. Patient will list coping skills they use to fulfill their needs 3. Patient will identify the differences between healthy and  unhealthy coping skills 4. Patient will verbalize the costs and benefits of drinking/drugging versus making the choice to change 5. Patient will learn how to use the exercise to identify the most important supports to put in place so that they can succeed in a change to which they commit  Summary of Patient Progress: During group, patient expressed that he is here to deal with a problem that he has with alcohol.  He participated very actively in the creation of the decisional balance exercise on the whiteboard.  He showed good insight.   Therapeutic Modalities Cognitive Behavioral Therapy Motivational Interviewing   Lynnell ChadMareida J Grossman-Orr, LCSW

## 2017-04-30 NOTE — Progress Notes (Signed)
[facility] MD Progress Note  04/30/2017 12:54 PM Benjamin Lindsey  MRN:  [id number]   Subjective: Patient stated that I am fine, little worried about my job, I have been taking my medication as prescribed here and they have been helping to me. Patient stated he has no suicidal attempts but told his dad is trying to overdose because he wanted to get help and does not want to be kicked out of the home because of his drinking behavior.  Objective: Patient seen, chart reviewed and case discussed with the treatment team. Patient reported he has been drinking alcohol at home even though he knows his dad was completely anti-alcohol. Patient blood alcohol level is 355 on arrival. Patient denied alcohol withdrawal symptoms withdrawal seizures or hallucinations during this evaluation. Patient is calm, cooperative and pleasant during this assessment. 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 Patient has poor insight, judgment and has limited insight into his drinking behavior and limited interest in residential treatment. Patient stated he is planning to go to try psychiatry to see a substance abuse counselor upon discharge from the hospital. Patient is hoping to be discharged on Monday because he has appointment. Patient denies current suicidal/homicidal ideation, depressive symptoms, manic symptoms and has no evidence of psychosis.  Patient stated the he wanted to leave the ED and felt that he had been kidnapped and held against his will.  Principal Problem: MDD (major depressive disorder), recurrent severe, without psychosis (HCC) Diagnosis:   Patient Active Problem List   Diagnosis Date Noted  . Major depressive disorder, recurrent severe without psychotic features (HCC) [F33.2] 04/28/2017  . MDD (major depressive disorder), recurrent severe, without psychosis (HCC) [F33.2] 04/28/2017  . Alcohol dependence (HCC)  [F10.20] 08/03/2013  . Benzodiazepine dependence (HCC) [F13.20] 08/03/2013  . GAD (generalized anxiety disorder) [F41.1] 08/03/2013   Total Time spent with patient: 30 minutes  Past Psychiatric History: as per H&P  Past Medical History:  Past Medical History:  Diagnosis Date  . Alcohol abuse   . Anxiety   . Benzodiazepine dependence (HCC)     Past Surgical History:  Procedure Laterality Date  . ROTATOR CUFF REPAIR     Family History: History reviewed. No pertinent family history. Family Psychiatric  History: as per H&P. Social History:  History  Alcohol Use No    Comment: quit 02/09/17     History  Drug Use No    Social History   Social History  . Marital status: Single    Spouse name: N/A  . Number of children: N/A  . Years of education: N/A   Social History Main Topics  . Smoking status: Current Every Day Smoker    Packs/day: 1.00    Types: Cigarettes  . Smokeless tobacco: Never Used  . Alcohol use No     Comment: quit 02/09/17  . Drug use: No  . Sexual activity: Not Asked   Other Topics Concern  . None   Social History Narrative  . None   Additional Social History:                         Sleep: Fair  Appetite:  Fair  Current Medications: Current Facility-Administered Medications  Medication Dose Route Frequency Provider Last Rate Last Dose  . acetaminophen (TYLENOL) tablet 650 mg  650 mg Oral Q6H PRN Laveda Abbe, NP  650 mg at 04/29/17 2304  . alum & mag hydroxide-simeth (MAALOX/MYLANTA) 200-200-20 MG/5ML suspension 30 mL  30 mL Oral Q4H PRN Laveda AbbeParks, Laurie Britton, NP      . amoxicillin-clavulanate (AUGMENTIN) 875-125 MG per tablet 1 tablet  1 tablet Oral Q12H Nira ConnBerry, Jason A, NP   1 tablet at 04/30/17 [phone]  . chlordiazePOXIDE (LIBRIUM) capsule 25 mg  25 mg Oral Q6H PRN Nira ConnBerry, Jason A, NP      . chlordiazePOXIDE (LIBRIUM) capsule 25 mg  25 mg Oral TID Nira ConnBerry, Jason A, NP   25 mg at 04/30/17 1214   Followed by  . [START ON  05/01/2017] chlordiazePOXIDE (LIBRIUM) capsule 25 mg  25 mg Oral BH-qamhs Jackelyn PolingBerry, Jason A, NP       Followed by  . [START ON 05/02/2017] chlordiazePOXIDE (LIBRIUM) capsule 25 mg  25 mg Oral Daily Nira ConnBerry, Jason A, NP      . gabapentin (NEURONTIN) capsule 200 mg  200 mg Oral BID Laveda AbbeParks, Laurie Britton, NP   200 mg at 04/30/17 [id number]  . hydrOXYzine (ATARAX/VISTARIL) tablet 25 mg  25 mg Oral TID PRN Laveda AbbeParks, Laurie Britton, NP   25 mg at 04/29/17 2302  . loperamide (IMODIUM) capsule 2-4 mg  2-4 mg Oral PRN Nira ConnBerry, Jason A, NP      . magnesium hydroxide (MILK OF MAGNESIA) suspension 30 mL  30 mL Oral Daily PRN Laveda AbbeParks, Laurie Britton, NP      . mupirocin cream (BACTROBAN) 2 %   Topical BID Nira ConnBerry, Jason A, NP      . ondansetron (ZOFRAN-ODT) disintegrating tablet 4 mg  4 mg Oral Q6H PRN Nira ConnBerry, Jason A, NP      . sertraline (ZOLOFT) tablet 100 mg  100 mg Oral Daily Laveda AbbeParks, Laurie Britton, NP   100 mg at 04/30/17 [id number]  . thiamine (VITAMIN B-1) tablet 100 mg  100 mg Oral Daily Nira ConnBerry, Jason A, NP   100 mg at 04/30/17 0811  . traZODone (DESYREL) tablet 50 mg  50 mg Oral QHS PRN Laveda AbbeParks, Laurie Britton, NP   50 mg at 04/29/17 2302    Lab Results: No results found for this or any previous visit (from the past 48 hour(s)).  Blood Alcohol level:  Lab Results  Component Value Date   ETH 355 (HH) 04/27/2017   ETH 249 (H) 01/06/2017    Metabolic Disorder Labs: No results found for: HGBA1C, MPG No results found for: PROLACTIN No results found for: CHOL, TRIG, HDL, CHOLHDL, VLDL, LDLCALC  Physical Findings: AIMS: Facial and Oral Movements Muscles of Facial Expression: None, normal Lips and Perioral Area: None, normal Jaw: None, normal Tongue: None, normal,Extremity Movements Upper (arms, wrists, hands, fingers): None, normal Lower (legs, knees, ankles, toes): None, normal, Trunk Movements Neck, shoulders, hips: None, normal, Overall Severity Severity of abnormal movements (highest score from questions above):  None, normal Incapacitation due to abnormal movements: None, normal Patient's awareness of abnormal movements (rate only patient's report): No Awareness, Dental Status Current problems with teeth and/or dentures?: Yes Does patient usually wear dentures?: No  CIWA:  CIWA-Ar Total: 0 COWS:     Musculoskeletal: Strength & Muscle Tone: within normal limits Gait & Station: normal Patient leans: N/A  Psychiatric Specialty Exam: Physical Exam  ROS  Blood pressure 125/85, pulse 76, temperature 97.8 F (36.6 C), temperature source Oral, resp. rate 12, height 5\' 11"  (1.803 m), weight 95.3 kg (210 lb).Body mass index is 29.29 kg/m.  General Appearance: Casual  Eye Contact:  Good  Speech:  Clear and Coherent  Volume:  Decreased  Mood:  Anxious and Depressed  Affect:  Congruent and Depressed  Thought Process:  Coherent and Goal Directed  Orientation:  Full (Time, Place, and Person)  Thought Content:  WDL  Suicidal Thoughts:  No, minimizes suicidal ideation, intention or plans  Homicidal Thoughts:  No, denied suicidal ideation, intention or plans.  Memory:  Immediate;   Good Recent;   Fair Remote;   Fair  Judgement:  Impaired  Insight:  Fair  Psychomotor Activity:  Decreased  Concentration:  Concentration: Good  Recall:  Fair  Fund of Knowledge:  Good  Language:  Good  Akathisia:  Negative  Handed:  Right  AIMS (if indicated):     Assets:  Communication Skills Desire for Improvement Financial Resources/Insurance Housing Leisure Time Physical Health Resilience Social Support Talents/Skills Transportation Vocational/Educational  ADL's:  Intact  Cognition:  WNL  Sleep:  Number of Hours: 6.5     Treatment Plan Summary: 36 years old male admitted with a major depressive disorder, recurrent severe without psychotic symptoms, alcohol dependence with intoxication, benzodiazepine dependence and generalized anxiety disorder. Patient has poor insight judgment and impulse control.  Patient minimizing his symptoms of depression anxiety and substance abuse during my evaluation.  Daily contact with patient to assess and evaluate symptoms and progress in treatment and Medication management   1. Will maintain Q 15 minutes observation for safety. Estimated LOS: 5-7 days 2. Patient will participate in group, milieu, and family therapy. Psychotherapy: Social and Doctor, hospital, anti-bullying, learning based strategies, cognitive behavioral, and family object relations individuation separation intervention psychotherapies can be considered.  3. Continue Librium detox protocol of alcohol and CIWA protocol 4. Continue Sertraline 100 mg daily for depression.  5. Insomnia - Continue Trazodone 50 mg at bed time.  6. Will continue to monitor patient's mood and behavior. 7. Social Work will schedule a Family meeting to obtain collateral information and discuss discharge and follow up plan.  8. Discharge concerns will also be addressed: Safety, stabilization, and access to medication  Leata Mouse, MD 04/30/2017, 12:54 PM

## 2017-05-01 DIAGNOSIS — F17210 Nicotine dependence, cigarettes, uncomplicated: Secondary | ICD-10-CM

## 2017-05-01 DIAGNOSIS — G4700 Insomnia, unspecified: Secondary | ICD-10-CM

## 2017-05-01 DIAGNOSIS — F332 Major depressive disorder, recurrent severe without psychotic features: Principal | ICD-10-CM

## 2017-05-01 DIAGNOSIS — Z79899 Other long term (current) drug therapy: Secondary | ICD-10-CM

## 2017-05-01 NOTE — Progress Notes (Signed)
[facility]  05/01/2017 1:33 PM Mayer CamelMichael A Bejarano  MRN:  [id number]   Subjective: Casimiro NeedleMichael reports " I am feeling alright, I was told I was discharging today, I really need to get back to work soon." Reports he has spoken to his parents any was told that I can come back home.   Objective: Mayer CamelMichael A Sarrazin is awake, alert and oriented. Seen resting in bedroom.  Denies suicidal or homicidal ideations during this assessment. Denies auditory or visual hallucination and does not appear to be responding to internal stimuli.   Patient reports he is medication compliant without mediation side effects.   States his depression 4/10.   Reports good appetite and states he is resting well. Support, encouragement and reassurance was provided. .  Principal Problem: MDD (major depressive disorder), recurrent severe, without psychosis (HCC) Diagnosis:   Patient Active Problem List   Diagnosis Date Noted  . Major depressive disorder, recurrent severe without psychotic features (HCC) [F33.2] 04/28/2017  . MDD (major depressive disorder), recurrent severe, without psychosis (HCC) [F33.2] 04/28/2017  . Alcohol dependence (HCC) [F10.20] 08/03/2013  . Benzodiazepine dependence (HCC) [F13.20] 08/03/2013  . GAD (generalized anxiety disorder) [F41.1] 08/03/2013   Total Time spent with patient: 30 minutes  Past Psychiatric History: as per H&P  Past Medical History:  Past Medical History:  Diagnosis Date  . Alcohol abuse   . Anxiety   . Benzodiazepine dependence (HCC)     Past Surgical History:  Procedure Laterality Date  . ROTATOR CUFF REPAIR     Family History: History reviewed. No pertinent family history. Family Psychiatric  History: as per H&P. Social History:  History  Alcohol Use No    Comment: quit 02/09/17     History  Drug Use No    Social History   Social History  . Marital status: Single    Spouse name: N/A  . Number of children: N/A  . Years of education: N/A   Social  History Main Topics  . Smoking status: Current Every Day Smoker    Packs/day: 1.00    Types: Cigarettes  . Smokeless tobacco: Never Used  . Alcohol use No     Comment: quit 02/09/17  . Drug use: No  . Sexual activity: Not Asked   Other Topics Concern  . None   Social History Narrative  . None   Additional Social History:                         Sleep: Fair  Appetite:  Fair  Current Medications: Current Facility-Administered Medications  Medication Dose Route Frequency Provider Last Rate Last Dose  . acetaminophen (TYLENOL) tablet 650 mg  650 mg Oral Q6H PRN Laveda AbbeParks, Laurie Britton, NP   650 mg at 04/29/17 2304  . alum & mag hydroxide-simeth (MAALOX/MYLANTA) 200-200-20 MG/5ML suspension 30 mL  30 mL Oral Q4H PRN Laveda AbbeParks, Laurie Britton, NP      . amoxicillin-clavulanate (AUGMENTIN) 875-125 MG per tablet 1 tablet  1 tablet Oral Q12H Nira ConnBerry, Jason A, NP   1 tablet at 05/01/17 0921  . chlordiazePOXIDE (LIBRIUM) capsule 25 mg  25 mg Oral Q6H PRN Nira ConnBerry, Jason A, NP      . chlordiazePOXIDE (LIBRIUM) capsule 25 mg  25 mg Oral BH-qamhs Nira ConnBerry, Jason A, NP   25 mg at 05/01/17 0925   Followed by  . [START ON 05/02/2017] chlordiazePOXIDE (LIBRIUM) capsule 25 mg  25 mg Oral Daily Jackelyn PolingBerry, Jason A, NP      .  gabapentin (NEURONTIN) capsule 200 mg  200 mg Oral BID Laveda Abbe, NP   200 mg at 05/01/17 1914  . hydrOXYzine (ATARAX/VISTARIL) tablet 25 mg  25 mg Oral TID PRN Laveda Abbe, NP   25 mg at 04/30/17 2131  . loperamide (IMODIUM) capsule 2-4 mg  2-4 mg Oral PRN Nira Conn A, NP      . magnesium hydroxide (MILK OF MAGNESIA) suspension 30 mL  30 mL Oral Daily PRN Laveda Abbe, NP      . mupirocin cream (BACTROBAN) 2 %   Topical BID Nira Conn A, NP      . ondansetron (ZOFRAN-ODT) disintegrating tablet 4 mg  4 mg Oral Q6H PRN Nira Conn A, NP      . sertraline (ZOLOFT) tablet 100 mg  100 mg Oral Daily Laveda Abbe, NP   100 mg at 05/01/17 7829  .  thiamine (VITAMIN B-1) tablet 100 mg  100 mg Oral Daily Nira Conn A, NP   100 mg at 05/01/17 5621  . traZODone (DESYREL) tablet 50 mg  50 mg Oral QHS PRN Laveda Abbe, NP   50 mg at 04/30/17 2131    Lab Results: No results found for this or any previous visit (from the past 48 hour(s)).  Blood Alcohol level:  Lab Results  Component Value Date   ETH 355 (HH) 04/27/2017   ETH 249 (H) 01/06/2017    Metabolic Disorder Labs: No results found for: HGBA1C, MPG No results found for: PROLACTIN No results found for: CHOL, TRIG, HDL, CHOLHDL, VLDL, LDLCALC  Physical Findings: AIMS: Facial and Oral Movements Muscles of Facial Expression: None, normal Lips and Perioral Area: None, normal Jaw: None, normal Tongue: None, normal,Extremity Movements Upper (arms, wrists, hands, fingers): None, normal Lower (legs, knees, ankles, toes): None, normal, Trunk Movements Neck, shoulders, hips: None, normal, Overall Severity Severity of abnormal movements (highest score from questions above): None, normal Incapacitation due to abnormal movements: None, normal Patient's awareness of abnormal movements (rate only patient's report): No Awareness, Dental Status Current problems with teeth and/or dentures?: Yes Does patient usually wear dentures?: No  CIWA:  CIWA-Ar Total: 1 COWS:     Musculoskeletal: Strength & Muscle Tone: within normal limits Gait & Station: normal Patient leans: N/A  Psychiatric Specialty Exam: Physical Exam  Vitals reviewed. Constitutional: He is oriented to person, place, and time. He appears well-developed.  Neurological: He is alert and oriented to person, place, and time.    Review of Systems  Psychiatric/Behavioral: Positive for depression. Negative for hallucinations and suicidal ideas. The patient is not nervous/anxious.     Blood pressure (!) 138/96, pulse 91, temperature 97.6 F (36.4 C), temperature source Oral, resp. rate 18, height 5\' 11"  (1.803 m),  weight 95.3 kg (210 lb).Body mass index is 29.29 kg/m.  General Appearance: Casual  Eye Contact:  Good  Speech:  Clear and Coherent  Volume:  Decreased  Mood:  Anxious and Depressed  Affect:  Congruent and Depressed  Thought Process:  Coherent and Goal Directed  Orientation:  Full (Time, Place, and Person)  Thought Content:  WDL  Suicidal Thoughts:  No, Denies during this assessment   Homicidal Thoughts:  No,   Memory:  Immediate;   Good Recent;   Fair Remote;   Fair  Judgement:  Impaired  Insight:  Fair  Psychomotor Activity:  Decreased  Concentration:  Concentration: Good  Recall:  Fair  Fund of Knowledge:  Good  Language:  Good  Akathisia:  Negative  Handed:  Right  AIMS (if indicated):     Assets:  Desire for Improvement Financial Resources/Insurance Housing Physical Health Vocational/Educational  ADL's:  Intact  Cognition:  WNL  Sleep:  Number of Hours: 6.5     I agree with current treatment plan on 05/01/2017, Patient seen face-to-face for psychiatric evaluation follow-up, chart reviewed and case discussed.  Reviewed the information documented and agree with the treatment plan.  Treatment Plan Summary:  Daily contact with patient to assess and evaluate symptoms and progress in treatment and Medication management   Continue current medication treatment  on 05/01/2017  Continue with Zoloft 100 mg and Neurontin 200 mg  for mood stabilization. Continue with Trazodone 50 mg for insomnia Started on CWIA/ Librium Protocol Will continue to monitor vitals ,medication compliance and treatment side effects while patient is here.  CSW will start working on disposition.  Patient to participate in therapeutic milieu  Oneta Rack, NP 05/01/2017, 1:33 PM

## 2017-05-01 NOTE — [facility] Group Notes (Signed)
[facility]  Date/Time:  05/01/2017 1:15-2:15PM  Type of Therapy and Topic:  Group Therapy:  Healthy and Unhealthy Supports  Participation Level:  Active   Description of Group:  Patients in this group were introduced to the idea of adding a variety of healthy supports to address the various needs in their lives. The picture on the front of Sunday's workbook was used to demonstrate why more supports are needed in every patient's life.  Patients identified and described healthy supports versus unhealthy supports in general, then gave examples of each in their own lives.   They discussed what additional healthy supports could be helpful in their recovery and wellness after discharge in order to prevent future hospitalizations.   An emphasis was placed on using counselor, doctor, therapy groups, 12-step groups, and problem-specific support groups to expand supports.  They also worked as a group on developing a specific plan for several patients to deal with unhealthy supports through boundary-setting, psychoeducation with loved ones, and even termination of relationships.   Therapeutic Goals:   1)  discuss importance of adding supports to stay well once out of the hospital  2)  compare healthy versus unhealthy supports and identify some examples of each  3)  generate ideas and descriptions of healthy supports that can be added  4)  offer mutual support about how to address unhealthy supports  5)  encourage active participation in and adherence to discharge plan    Summary of Patient Progress:  The patient shared that the current unhealthy supports available in his life are not having connections and the ease of access of alcohol, while the current healthy supports are his family, especially his mother who is a recovering alcoholic.  The patient expressed a willingness to add AA to help in his recovery journey.   Therapeutic Modalities:   Motivational Interviewing Brief  Solution-Focused Therapy  Ambrose MantleMareida Grossman-Orr, LCSW 05/01/2017, 2:15PM

## 2017-05-01 NOTE — Progress Notes (Signed)
Patient did attend the evening speaker AA meeting.  

## 2017-05-01 NOTE — Progress Notes (Signed)
Benjamin Lindsey is seen sitting in the dayroom watching TV today. HE is [pleasant, calm and cooperative. He makes good eye contact with Clinical research associatewriter. He takes his scheduled meds as planned. A HE completed his daily assessment and on this he wrote he deneid experiencing SI today and he rated his depression, hopelessness and anxeity " 0/0/0/", respectively. R Safety is in place. Pt states he's hoping to be discharged tomorrow.

## 2017-05-01 NOTE — Progress Notes (Signed)
Patient has been up and active on the unit, attended group this evening and has voiced no complaints. Patient currently denies having pain, -si/hi/a/v hall. Support and encouragement offered, safety maintained on unit, will continue to monitor.  

## 2017-05-02 MED ORDER — AMOXICILLIN-POT CLAVULANATE 875-125 MG PO TABS: 1.0000 | ORAL_TABLET | Freq: Two times a day (BID) | ORAL | 0 refills | Status: AC

## 2017-05-02 MED ORDER — SERTRALINE HCL 100 MG PO TABS: 100.0000 mg | ORAL_TABLET | Freq: Every day | ORAL | 0 refills | Status: AC

## 2017-05-02 MED ORDER — MUPIROCIN CALCIUM 2 % EX CREA: TOPICAL_CREAM | Freq: Two times a day (BID) | CUTANEOUS | 0 refills | Status: AC

## 2017-05-02 MED ORDER — ACAMPROSATE CALCIUM 333 MG PO TBEC
666.0000 mg | DELAYED_RELEASE_TABLET | Freq: Three times a day (TID) | ORAL | Status: DC
  Administered 2017-05-02: 666 mg via ORAL
  Filled 2017-05-02 (×2): qty 2
  Filled 2017-05-02: qty 42
  Filled 2017-05-02: qty 2
  Filled 2017-05-02 (×2): qty 42

## 2017-05-02 MED ORDER — TRAZODONE HCL 50 MG PO TABS: 50.0000 mg | ORAL_TABLET | Freq: Every evening | ORAL | 0 refills | Status: AC | PRN

## 2017-05-02 MED ORDER — GABAPENTIN 100 MG PO CAPS: 200.0000 mg | ORAL_CAPSULE | Freq: Two times a day (BID) | ORAL | 0 refills | Status: AC

## 2017-05-02 MED ORDER — HYDROXYZINE HCL 25 MG PO TABS: 25.0000 mg | ORAL_TABLET | Freq: Three times a day (TID) | ORAL | 0 refills | Status: AC | PRN

## 2017-05-02 MED ORDER — ACAMPROSATE CALCIUM 333 MG PO TBEC: 666.0000 mg | DELAYED_RELEASE_TABLET | Freq: Three times a day (TID) | ORAL | 0 refills | Status: AC

## 2017-05-02 NOTE — Progress Notes (Signed)
Patient has been up and active on the unit, attended group this evening and is looking forward to discharging on tomorrow. Patient currently denies having pain, -si/hi/a/v hall. Support and encouragement offered, safety maintained on unit, will continue to monitor.  

## 2017-05-02 NOTE — Discharge Summary (Signed)
Physician Discharge Summary Note  Patient:  Benjamin Lindsey is an 36 y.o., male MRN:  [id number] DOB:  [birth year] Patient phone:  [phone] (home)  Patient address:   [address] [place] [address],  Total Time spent with patient: 30 minutes  Date of Admission:  04/28/2017 Date of Discharge: 05/02/2017  Reason for Admission: Per HPI-36 y.o.single male, who was voluntarily brought into [facility]. Patient stated that he did not understand why he was brought to the ED. Patient reported consumption of wine, to relieve pain, due to recently suffering an injury on his shoulder. Per medical labs, Patient's Ethanol level was 355 mg/dL at 2952. Patient reported becoming angry with his father, resulting in him saying that he took several of his prescribed [facility] [employer]. Patient reported that he lied about the consumption of [employer], due to being angry and had no current or past suicidal ideations. Per medical records 04/27/2017 ([name], RN), "He reportedly told his family that he took 40 of the 0.5 mg doses of Benjamin Lindsey. Of note, the patient is prescribed 0.5 mg 7 times a day by his psychiatrist." UDS pending at time of assessment. Patient denies suicidal ideations, homicidal ideations, auditory/visual hallucinations, self-injurious behaviors, or access to weapons. Patient reported current experiences with depressive symptoms, such as fatigue and angery.  Patient reported currently residing with his parents. Patient reported having an upcoming court date for the violation of a restraining order completed by his father. Patient stated that he issue current was resolved, enabling him to return back to his parent's residency. Patient identified his father as a supportive factor for him. Patient identified recent stressors associated with a stressful work environment. Patient denies any history of physical, sexual, or verbal abuse. Patient reported no family history of suicide and substance abuse.  Patient stated that he is currently seeing an outpatient psychiatrist, Dr. Jeanie Lindsey, for anxiety, who prescribed him the [employer]. Patient stated that he has been compliant with all medications.  During assessment, Patient was cooperative during assessment. Patient was dressed in scrubs and sitting on his bed. Patient was oriented to time, person, location, however not the situation. Patient's eye contact was fair. Patient's motor activity consisted of restlessness and agitation. Patient's speech was argumentative, loud, and slurred. Patient's level of consciousness was sedated and irritable. Patient's mood appeared to be anxious and irritable. Patient's affect was irritable and angry. Patient's anxiety level was moderate. Patient's thought process was circumstantial and relevant. Patient's judgment appeared to be impaired. Patient stated the he wanted to leave the ED and felt that he had been kidnapped and held against his will.  Patient presents today in a pleasant and cooperative mood. He admits the above information. He reports that once his dad knew about the alcohol he would kick him out, because his dad has done it before. He states that he told his dad that he overdosed so his dad would feel sympathetic and not kick him out, he did not expect to be IVC'd. He continues denying any SI/HI/AVH. He admits depression, but plans to continue his [facility] [employer]. He admits anxiety and plans to continue his [employer]. He also reports that he only takes 1-2 Benjamin Lindsey a day. He refuses detox and refuses rehab. He is more concerned about being discharged so he can go back to work. 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 He admits to drinking excessively on his days off but not during the work days. He reports that he has had 2  DUIs in the past but still has his DL  Principal Problem: MDD (major depressive disorder), recurrent severe, without psychosis (HCC) Discharge  Diagnoses: Patient Active Problem List   Diagnosis Date Noted  . Major depressive disorder, recurrent severe without psychotic features (HCC) [F33.2] 04/28/2017  . MDD (major depressive disorder), recurrent severe, without psychosis (HCC) [F33.2] 04/28/2017  . Alcohol dependence (HCC) [F10.20] 08/03/2013  . Benzodiazepine dependence (HCC) [F13.20] 08/03/2013  . GAD (generalized anxiety disorder) [F41.1] 08/03/2013    Past Psychiatric History:   Past Medical History:  Past Medical History:  Diagnosis Date  . Alcohol abuse   . Anxiety   . Benzodiazepine dependence (HCC)     Past Surgical History:  Procedure Laterality Date  . ROTATOR CUFF REPAIR     Family History: History reviewed. No pertinent family history. Family Psychiatric  History:  Social History:  History  Alcohol Use No    Comment: quit 02/09/17     History  Drug Use No    Social History   Social History  . Marital status: Single    Spouse name: N/A  . Number of children: N/A  . Years of education: N/A   Social History Main Topics  . Smoking status: Current Every Day Smoker    Packs/day: 1.00    Types: Cigarettes  . Smokeless tobacco: Never Used  . Alcohol use No     Comment: quit 02/09/17  . Drug use: No  . Sexual activity: Not Asked   Other Topics Concern  . None   Social History Narrative  . None    Hospital Course:  Ben[facility] [employer] was admitted for MDD (major depressive disorder), recurrent severe, without psychosis (HCC)  and crisis management.  Pt was treated discharged with the medications listed below under Medication List.  Medical problems were identified and treated as needed.  Home medications were restarted as appropriate.  Improvement was monitored by observation and [facility] [employer] 's daily report of symptom reduction.  Emotional and mental status was monitored by daily self-inventory reports completed by Benjamin Lindsey and clinical staff.         Benjamin Lindsey was evaluated by  the treatment team for stability and plans for continued recovery upon discharge. Benjamin [employer] 's motivation was an integral factor for scheduling further treatment. Employment, transportation, bed availability, health status, family support, and any pending legal issues were also considered during hospital stay. Pt was offered further treatment options upon discharge including but not limited to Residential, Intensive Outpatient, and Outpatient treatment.  Benjamin Lindsey will follow up with the services as listed below under Follow Up Information.     Upon completion of this admission the patient was both mentally and medically stable for discharge denying suicidal/homicidal ideation, auditory/visual/tactile hallucinations, delusional thoughts and paranoia.    [employer] responded well to treatment with Campal 666 mg, Trazodone 50 mg, Benjamin [facility] 100mg  without adverse effects. Pt demonstrated improvement without reported or observed adverse effects to the point of stability appropriate for outpatient management. Pertinent labs include:Lipids, CMP and CBC, for which outpatient follow-up is necessary for lab recheck as mentioned below. Reviewed CBC, CMP, BAL, and UDS; all unremarkable aside from noted exceptions.   Physical Findings: AIMS: Facial and Oral Movements Muscles of Facial Expression: None, normal Lips and Perioral Area: None, normal Jaw: None, normal Tongue: None, normal,Extremity Movements Upper (arms, wrists, hands, fingers): None, normal Lower (legs, knees, ankles, toes): None, normal, Trunk Movements Neck, shoulders, hips: None,  normal, Overall Severity Severity of abnormal movements (highest score from questions above): None, normal Incapacitation due to abnormal movements: None, normal Patient's awareness of abnormal movements (rate only patient's report): No Awareness, Dental Status Current problems with teeth and/or dentures?: Yes Does patient usually wear dentures?: No   CIWA:  CIWA-Ar Total: 0 COWS:     Musculoskeletal: Strength & Muscle Tone: within normal limits Gait & Station: normal Patient leans: N/A  Psychiatric Specialty Exam: See SRA by MD Physical Exam  ROS  Blood pressure 126/88, pulse 73, temperature 97.8 F (36.6 C), temperature source Oral, resp. rate 16, height 5\' 11"  (1.803 m), weight 95.3 kg (210 lb).Body mass index is 29.29 kg/m.   Have you used any form of tobacco in the last 30 days? (Cigarettes, Smokeless Tobacco, Cigars, and/or Pipes): Yes  Has this patient used any form of tobacco in the last 30 days? (Cigarettes, Smokeless Tobacco, Cigars, and/or Pipes)  No  Blood Alcohol level:  Lab Results  Component Value Date   ETH 355 (HH) 04/27/2017   ETH 249 (H) 01/06/2017    Metabolic Disorder Labs:  No results found for: HGBA1C, MPG No results found for: PROLACTIN No results found for: CHOL, TRIG, HDL, CHOLHDL, VLDL, LDLCALC  See Psychiatric Specialty Exam and Suicide Risk Assessment completed by Attending Physician prior to discharge.  Discharge destination:  Home  Is patient on multiple antipsychotic therapies at discharge:  No   Has Patient had three or more failed trials of antipsychotic monotherapy by history:  No  Recommended Plan for Multiple Antipsychotic Therapies: NA  Discharge Instructions    Diet - low sodium heart healthy    Complete by:  As directed    Discharge instructions    Complete by:  As directed    Take all medications as prescribed. Keep all follow-up appointments as scheduled.  Do not consume alcohol or use illegal drugs while on prescription medications. Report any adverse effects from your medications to your primary care provider promptly.  In the event of recurrent symptoms or worsening symptoms, call 911, a crisis hotline, or go to the nearest emergency department for evaluation.   Increase activity slowly    Complete by:  As directed      Allergies as of 05/02/2017   No Known  Allergies     Medication List    STOP taking these medications   acetaminophen 325 MG tablet Commonly known as:  TYLENOL   [employer] [employer] 0.5 MG tablet Generic drug:  clonazePAM   methocarbamol 500 MG tablet Commonly known as:  ROBAXIN   VISINE OP     TAKE these medications     Indication  acamprosate 333 MG tablet Commonly known as:  CAMPRAL Take 2 tablets (666 mg total) by mouth 3 (three) times daily with meals.  Indication:  Excessive Use of Alcohol   amoxicillin-clavulanate 875-125 MG tablet Commonly known as:  AUGMENTIN Take 1 tablet by mouth every 12 (twelve) hours.  Indication:  Infection of the Skin and/or Skin Structures   gabapentin 100 MG capsule Commonly known as:  NEURONTIN Take 2 capsules (200 mg total) by mouth 2 (two) times daily.  Indication:  Alcohol Withdrawal Syndrome   hydrOXYzine 25 MG tablet Commonly known as:  ATARAX/VISTARIL Take 1 tablet (25 mg total) by mouth 3 (three) times daily as needed for anxiety. What changed:  when to take this  reasons to take this  Indication:  Feeling Anxious   mupirocin cream 2 % Commonly known as:  MicrosoftBACTROBAN  Apply topically 2 (two) times daily.  Indication:  Minor Skin Infection due to a Bacteria   sertraline 100 MG tablet Commonly known as:  [facility] [employer] Take 1 tablet (100 mg total) by mouth daily. What changed:  medication strength  Indication:  Major Depressive Disorder   traZODone 50 MG tablet Commonly known as:  DESYREL Take 1 tablet (50 mg total) by mouth at bedtime as needed for sleep.  Indication:  Trouble Sleeping      Follow-up Information    [facility] Follow up on 05/10/2017.   Specialty:  Behavioral Health Why:  Appointment for therapy on Tuessday, 10/30 at 1:00PM with [facility]. You also have a medication management appt with Dr. Betti Cruz on Thursday May 12, 2017 at 1:50PM. Thank you.     Contact information: [address] [place]  [address] [phone]           Follow-up recommendations:  Activity:  as tolerated Diet:  heart healthy  Comments:  Take all medications as prescribed. Keep all follow-up appointments as scheduled.  Do not consume alcohol or use illegal drugs while on prescription medications. Report any adverse effects from your medications to your primary care provider promptly.  In the event of recurrent symptoms or worsening symptoms, call 911, a crisis hotline, or go to the nearest emergency department for evaluation.   Signed: Oneta Rack, NP 05/02/2017, 10:13 AM   Patient seen, Suicide Assessment Completed.  Disposition Plan Reviewed

## 2017-05-02 NOTE — Progress Notes (Signed)
  [facility] :  Will you be returning to the same living situation after discharge:  Yes,  home At discharge, do you have transportation home?: Yes,  parent Do you have the ability to pay for your medications: Yes,  mental health  Release of information consent forms completed and submitted to medical records by CSW.  Patient to Follow up at: Follow-up Information    [facility] Follow up on 05/10/2017.   Specialty:  Behavioral Health Why:  [facility] Tuessday[facility] You also have a medication management appt with Dr. Betti Cruzeddy on Thursday May 12, 2017 at 1:50PM. Thank you.     Contact information: [address] [place] [address] [phone]           Next level of care provider [facility] Link:no  Safety Planning and Suicide Prevention discussed: Yes,  SPE completed with pt's mother. SPI pamphlet provided to pt including Mobile Crisis information  Have you used any form of tobacco in the last 30 days? (Cigarettes, Smokeless Tobacco, Cigars, and/or Pipes): Yes  Has patient been referred to the Quitline?: Patient refused referral  Patient has been referred for addiction treatment: Yes  Pulte HomesHeather N Smart, LCSW 05/02/2017, 10:13 AM

## 2017-05-02 NOTE — Discharge Instructions (Signed)
Pt educated on all discharge instructions and verbalizes understanding.

## 2017-05-02 NOTE — [facility] Suicide Risk Assessment (Signed)
[facility]   Principal Problem: MDD (major depressive disorder), recurrent severe, without psychosis (HCC) Discharge Diagnoses:  Patient Active Problem List   Diagnosis Date Noted  . Major depressive disorder, recurrent severe without psychotic features (HCC) [F33.2] 04/28/2017  . MDD (major depressive disorder), recurrent severe, without psychosis (HCC) [F33.2] 04/28/2017  . Alcohol dependence (HCC) [F10.20] 08/03/2013  . Benzodiazepine dependence (HCC) [F13.20] 08/03/2013  . GAD (generalized anxiety disorder) [F41.1] 08/03/2013    Total Time spent with patient: 30 minutes  Musculoskeletal: Strength & Muscle Tone: within normal limits Gait & Station: normal Patient leans: N/A  Psychiatric Specialty Exam: ROS denies headache, no visual disturbances, no chest pain, no shortness of breath, no vomiting   Blood pressure 126/88, pulse 73, temperature 97.8 F (36.6 C), temperature source Oral, resp. rate 16, height 5\' 11"  (1.803 m), weight 95.3 kg (210 lb).Body mass index is 29.29 kg/m.  General Appearance: Well Groomed  Eye Contact::  Good  Speech:  Normal Rate409  Volume:  Normal  Mood:  improved mood   Affect:  Appropriate and fuller in range   Thought Process:  Linear and Descriptions of Associations: Intact  Orientation:  Full (Time, Place, and Person)  Thought Content:  no hallucinations, no delusions, not internally preoccupied   Suicidal Thoughts:  No denies any suicidal or self injurious ideations, denies any homicidal or violent ideations  Homicidal Thoughts:  No  Memory:  recent and remote grossly intact   Judgement:  Other:  improving   Insight:  improving   Psychomotor Activity:  Normal  Concentration:  Good  Recall:  Good  Fund of Knowledge:Good  Language: Good  Akathisia:  Negative  Handed:  Right  AIMS (if indicated):     Assets:  Communication Skills Desire for Improvement Resilience  Sleep:  Number of Hours: 6.25  Cognition:  WNL  ADL's:  Intact   Mental Status Per Nursing Assessment::   On Admission:  Suicidal ideation indicated by patient  Demographic Factors:  36 year old male, employed, lives with father  Loss Factors: Family tension surrounding patient's alcohol consumption  Historical Factors: Alcohol Use Disorder, depression  Risk Reduction Factors:   Sense of responsibility to family, Employed, Living with another person, especially a relative and Positive coping skills or problem solving skills  Continued Clinical Symptoms:  Patient reports he is feeling better. At this time denies symptoms of alcohol withdrawal and does not appear to be in any acute distress. Mood improved, denies depression, affect appropriate, reactive. No thought disorder, no suicidal or self injurious ideations, denies homicidal or violent ideations, no hallucinations, no delusions, not  internally preoccupied. Future oriented . No disruptive or agitated behaviors on unit. Patient is presenting with improving insight - states he is aware he has a problem with alcohol, and states he is planning on attending AA regularly . He also agreed to start CAMPRAL to help with sobriety efforts .   Cognitive Features That Contribute To Risk:  No gross cognitive deficits noted upon discharge. Is alert , attentive, and oriented x 3   Suicide Risk:  Mild:  Suicidal ideation of limited frequency, intensity, duration, and specificity.  There are no identifiable plans, no associated intent, mild dysphoria and related symptoms, good self-control (both objective and subjective assessment), few other risk factors, and identifiable protective factors, including available and accessible social support.  Follow-up Information    [facility] Follow up on 05/02/2017.   Specialty:  Behavioral Health Why:  Appointment for therapy on 10/22 at 11:30 AM. Appointment may be calcelled   Contact information: [address] [place] [address] [phone]           Plan Of Care/Follow-up recommendations:  Activity:  as tolerated  Diet:  regular Tests:  NA Other:  see below Patient is expressing readiness for discharge and there are no current grounds for involuntary commitment  Plans to return home - lives with father Follow up as above. Full abstinence as treatment goal , 12 step program participation encouraged .  Craige Cotta, MD 05/02/2017, 9:44 AM

## 2017-05-02 NOTE — Plan of Care (Signed)
Problem: Activity: Goal: Interest or engagement in activities will improve Outcome: Progressing Patient visible in the milieu interacting with peers.  Problem: Coping: Goal: Ability to cope will improve Outcome: Progressing Patient denies SI/HI/AVH, took all meds as scheduled.  Comments: Patient is calm/cooperative, verbalizes readiness for discharge.  Reports a good mood, affect is bright, pt reports that he ate all of his breakfast, reported a good night's sleep, denies having any other concerns.  Q15 minute safety checks in place, will continue to monitor.

## 2017-05-02 NOTE — Progress Notes (Signed)
Patient ID: Benjamin Lindsey, male   DOB: 12/29/1980, 36 y.o.   MRN: [id number] Patient denies SI/HI/AVH, educated on his discharge instructions, verbalizes understanding.  Pt left hospital with all of his belongings, discharge instructions and prescription scripts, and transportation was provided by his mother who met him at the entrance to the unit.

## 2017-05-02 NOTE — Progress Notes (Signed)
Recreation Therapy Notes  Date:  05/02/17 Time: 0930 Location: [address] Dayroom  Group Topic: Stress Management  Goal Area(s) Addresses:  Patient will verbalize importance of using healthy stress management.  Patient will identify positive emotions associated with healthy stress management.   Behavioral Response: Engaged  Intervention: Stress Management  Activity :  Meditation.  LRT introduced the stress management technique of meditation.  LRT played a meditation leading patients through a body scan that allowed them to take note of the sensations they may be feeling.  Patients were to follow along as meditation played.  Education:  Stress Management, Discharge Planning.   Education Outcome: Acknowledges edcuation/In group clarification offered/Needs additional education  Clinical Observations/Feedback: Pt attended group.   Radford Pease, LRT/CTRS         Melford Tullier A 05/02/2017 10:54 AM 

## 2017-05-17 DIAGNOSIS — F17210 Nicotine dependence, cigarettes, uncomplicated: Secondary | ICD-10-CM | POA: Insufficient documentation

## 2017-05-17 DIAGNOSIS — M5416 Radiculopathy, lumbar region: Secondary | ICD-10-CM | POA: Insufficient documentation

## 2017-05-17 DIAGNOSIS — Z79899 Other long term (current) drug therapy: Secondary | ICD-10-CM | POA: Insufficient documentation

## 2017-05-18 ENCOUNTER — Emergency Department (HOSPITAL_COMMUNITY)
Admission: EM | Admit: 2017-05-18 | Discharge: 2017-05-18 | Disposition: A | Discharge status: Home / Self Care | Payer: Self-pay | Attending: Emergency Medicine | Admitting: Emergency Medicine

## 2017-05-18 ENCOUNTER — Encounter (HOSPITAL_COMMUNITY): Payer: Self-pay

## 2017-05-18 DIAGNOSIS — M5416 Radiculopathy, lumbar region: Secondary | ICD-10-CM

## 2017-05-18 DIAGNOSIS — W19XXXA Unspecified fall, initial encounter: Secondary | ICD-10-CM

## 2017-05-18 MED ORDER — NAPROXEN 500 MG PO TABS
500.0000 mg | ORAL_TABLET | Freq: Once | ORAL | Status: AC
  Administered 2017-05-18: 500 mg via ORAL
  Filled 2017-05-18: qty 1

## 2017-05-18 NOTE — ED Notes (Signed)
Pt was brought back to triage 5 and triaged, pt was in there a few minutes and yelled out of the door, when I went in to see what he needed he was laying on the floor and wouldn't get up, he proceeded to call myself and my NT a bitch, I called security to get him out of the building and see was seen by the MD and discharged, pt was continuing to talk to me very rudely on the way out of the ED

## 2017-05-18 NOTE — Discharge Instructions (Addendum)
Please take the medications prescribed. We suspect that you have nerve impingement, and it will get better with anti-inflammatory meds and stretching. See the specialist if not getting better.

## 2017-05-18 NOTE — ED Triage Notes (Signed)
Pt fell off a stool at [employer] tonight, he states he's only had one glass of wine  Pt couldn't stay awake when he first got here and he has slurry speech

## 2017-05-18 NOTE — ED Provider Notes (Signed)
[facility]-EMERGENCY DEPT Provider Note   CSN: [id number] Arrival date & time: 05/17/17  2258     History   Chief Complaint Chief Complaint  Patient presents with  . Alcohol Intoxication  . Fall    HPI Benjamin Lindsey is a 36 y.o. male.  HPI Patient comes in with chief complaint of fall.  Pt reports that he was at [employer] watching election coverage, when he got up slipped and fell.  Patient had instant pain over his back.  The back is described as sharp stabbing pain radiating down his left leg.  Since then patient has pain every time when he tries to get up.  Patient has no associated numbness or tingling.  Patient denies any previous history of back pain.  Pt has no associated numbness, weakness, urinary incontinence, urinary retention, bowel incontinence, pins and needle sensation in the perineal area.  Pt denies any head trauma or headaches.    Past Medical History:  Diagnosis Date  . Alcohol abuse   . Anxiety   . Benzodiazepine dependence [facility](HCC)     Patient Active Problem List   Diagnosis Date Noted  . Major depressive disorder, recurrent severe without psychotic features (HCC) 04/28/2017  . MDD (major depressive disorder), recurrent severe, without psychosis (HCC) 04/28/2017  . Alcohol dependence (HCC) 08/03/2013  . Benzodiazepine dependence (HCC) 08/03/2013  . GAD (generalized anxiety disorder) 08/03/2013    Past Surgical History:  Procedure Laterality Date  . ROTATOR CUFF REPAIR         Home Medications    Prior to Admission medications   Medication Sig Start Date End Date Taking? Authorizing Provider  acamprosate (CAMPRAL) 333 MG tablet Take 2 tablets (666 mg total) by mouth 3 (three) times daily with meals. 05/02/17   Oneta RackLewis, Tanika N, NP  amoxicillin-clavulanate (AUGMENTIN) 875-125 MG tablet Take 1 tablet by mouth every 12 (twelve) hours. 05/02/17   Oneta RackLewis, Tanika N, NP  gabapentin (NEURONTIN) 100 MG capsule Take 2  capsules (200 mg total) by mouth 2 (two) times daily. 05/02/17   Oneta RackLewis, Tanika N, NP  hydrOXYzine (ATARAX/VISTARIL) 25 MG tablet Take 1 tablet (25 mg total) by mouth 3 (three) times daily as needed for anxiety. 05/02/17   Oneta RackLewis, Tanika N, NP  mupirocin cream (BACTROBAN) 2 % Apply topically 2 (two) times daily. 05/02/17   Oneta RackLewis, Tanika N, NP  sertraline (ZOLOFT) 100 MG tablet Take 1 tablet (100 mg total) by mouth daily. 05/03/17   Oneta RackLewis, Tanika N, NP  traZODone (DESYREL) 50 MG tablet Take 1 tablet (50 mg total) by mouth at bedtime as needed for sleep. 05/02/17   Oneta RackLewis, Tanika N, NP    Family History History reviewed. No pertinent family history.  Social History Social History   Tobacco Use  . Smoking status: Current Every Day Smoker    Packs/day: 1.00    Types: Cigarettes  . Smokeless tobacco: Never Used  Substance Use Topics  . Alcohol use: No    Comment: quit 02/09/17  . Drug use: No     Allergies   Patient has no known allergies.   Review of Systems Review of Systems  Constitutional: Positive for activity change.  Musculoskeletal: Positive for back pain.  Allergic/Immunologic: Negative for immunocompromised state.  Neurological: Negative for weakness and numbness.  Hematological: Does not bruise/bleed easily.     Physical Exam Updated Vital Signs BP 125/83 (BP Location: Right Arm)   Pulse 98   Temp 98.3 F (36.8 C) (Oral)  Resp 18   SpO2 98%   Physical Exam  Constitutional: He is oriented to person, place, and time. He appears well-developed.  HENT:  Head: Atraumatic.  Neck: Neck supple.  Cardiovascular: Normal rate.  Pulmonary/Chest: Effort normal.  Musculoskeletal:  Pt has tenderness over the right posterior hip. Pt has no focal lumbar spine tenderness. No step offs, no erythema. Pt has 2+ patellar reflex bilaterally. Able to discriminate between sharp and dull. Able to ambulate   Neurological: He is alert and oriented to person, place, and time.    Skin: Skin is warm.  Nursing note and vitals reviewed.    ED Treatments / Results  Labs (all labs ordered are listed, but only abnormal results are displayed) Labs Reviewed - No data to display  EKG  EKG Interpretation None       Radiology No results found.  Procedures Procedures (including critical care time)  Medications Ordered in ED Medications  naproxen (NAPROSYN) tablet 500 mg (500 mg Oral Given 05/18/17 0215)     Initial Impression / Assessment and Plan / ED Course  I have reviewed the triage vital signs and the nursing notes.  Pertinent labs & imaging results that were available during my care of the patient were reviewed by me and considered in my medical decision making (see chart for details).     Patient comes in with chief complaint of fall and resultant back pain that is radiating down the left lower extremity.  No associated red flags to be concerned about cord compression.  On exam patient has no focal tenderness over the lumbar or sacral region.  Patient does have an appreciable spasm over the posterior part of the pelvis, towards left side.  Patient has ambulated.   Given that patient is having radicular symptoms in the setting of trauma we prescribed x-ray of the lumbar spine, however patient refused.  He is demanding that he be seen by specialist, and get a work note.  I informed patient that my concerns are that he likely has radiculopathy or sciatica.  X-rays are ordered to ensure that there is no significant structural abnormality, if the x-rays are negative then he needs physical therapy, NSAIDs.  I also informed him that we can give him a work note with restrictions of not lifting more than 20 pounds for the next week.  Patient does not have a primary care doctor and is concerned about his symptoms, and possibly getting fired from his job.  We will given follow-up with orthopedist in case he is not getting better.  Strict return precautions have been  discussed.  Final Clinical Impressions(s) / ED Diagnoses   Final diagnoses:  Fall, initial encounter  Lumbar nerve root impingement    ED Discharge Orders    None       Derwood KaplanNanavati, Dewanda Fennema, MD 05/18/17 [phone]

## 2017-07-22 ENCOUNTER — Other Ambulatory Visit: Payer: Self-pay | Admitting: Orthopedic Surgery

## 2017-07-22 DIAGNOSIS — M545 Low back pain: Secondary | ICD-10-CM

## 2017-07-27 ENCOUNTER — Ambulatory Visit
Admission: RE | Admit: 2017-07-27 | Discharge: 2017-07-27 | Disposition: A | Discharge status: Home / Self Care | Payer: Managed Care, Other (non HMO) | Source: Ambulatory Visit | Attending: Family Medicine | Admitting: Family Medicine

## 2017-07-27 ENCOUNTER — Other Ambulatory Visit: Payer: Self-pay | Admitting: Family Medicine

## 2017-07-27 ENCOUNTER — Ambulatory Visit
Admission: RE | Admit: 2017-07-27 | Discharge: 2017-07-27 | Disposition: A | Discharge status: Home / Self Care | Payer: Managed Care, Other (non HMO) | Source: Ambulatory Visit | Attending: Orthopedic Surgery | Admitting: Orthopedic Surgery

## 2017-07-27 DIAGNOSIS — M545 Low back pain: Secondary | ICD-10-CM

## 2017-07-27 DIAGNOSIS — M25311 Other instability, right shoulder: Secondary | ICD-10-CM

## 2017-07-27 MED ORDER — IOPAMIDOL (ISOVUE-M 200) INJECTION 41%
15.0000 mL | Freq: Once | INTRAMUSCULAR | Status: AC
  Administered 2017-07-27: 15 mL via INTRA_ARTICULAR

## 2018-07-02 IMAGING — CT CT MAXILLOFACIAL W/O CM
3 of 12 series · 15 of 47 positions shown, 18 images · non-contrast
Comparison: Prior CT of the head on 10/14/2003

CLINICAL DATA: Seizure, fall and tooth pain.

EXAM:
CT HEAD WITHOUT CONTRAST
CT MAXILLOFACIAL WITHOUT CONTRAST
CT CERVICAL SPINE WITHOUT CONTRAST
TECHNIQUE: Multidetector CT imaging of the head, cervical spine, and
maxillofacial structures were performed using the standard protocol
without intravenous contrast. Multiplanar CT image reconstructions
of the cervical spine and maxillofacial structures were also
generated.

[Series 2: facial st · axial · 0.32mm/px · z∈[-283,-217]mm · 4 of 88 slices shown]
[im 11/88  bone]
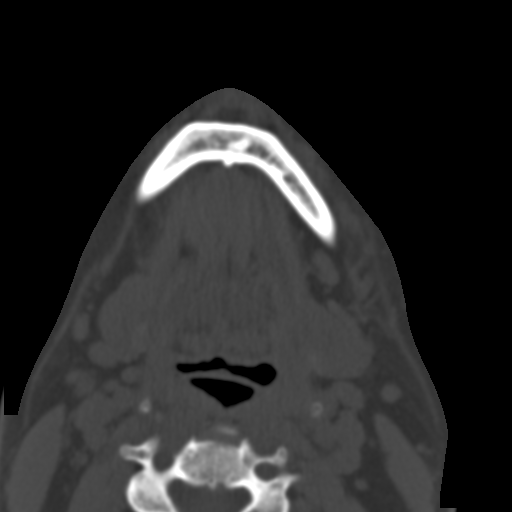
[im 22/88  bone]
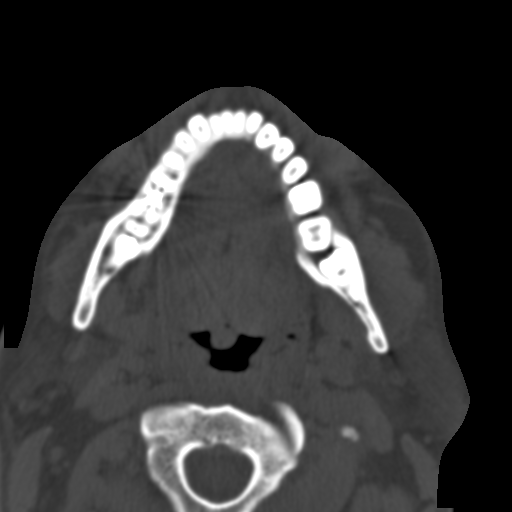
[im 33/88  bone]
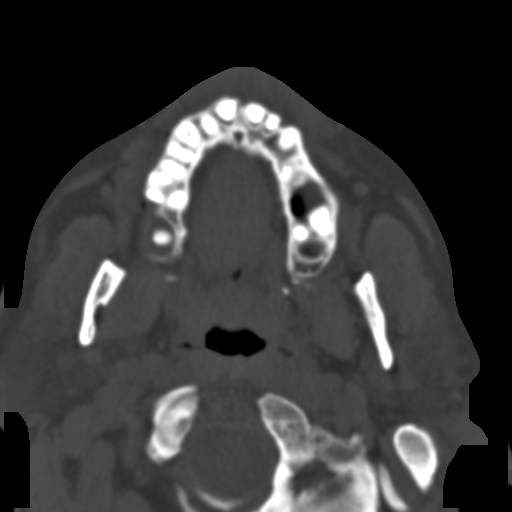
[im 44/88  bone]
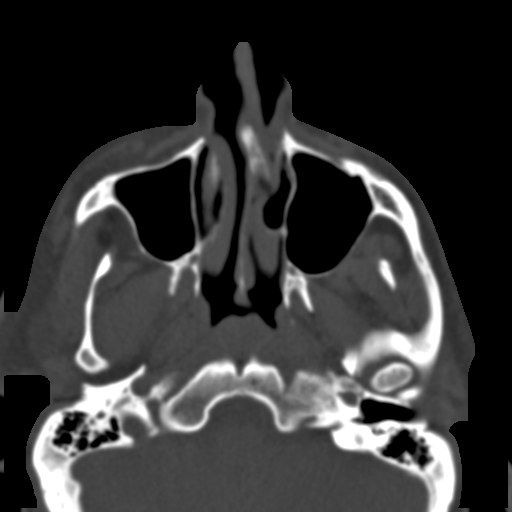

[Series 13: coronal · coronal · 0.34mm/px · 2 of 72 slices shown]
[im 24/72  bone]
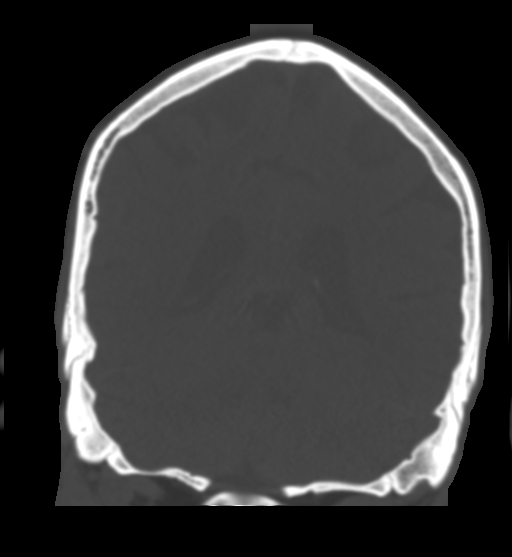
[im 48/72  bone]
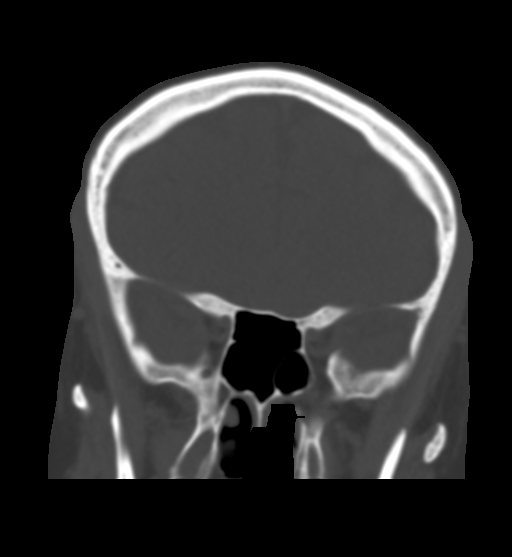

[Series 15: c-spine st · axial · 0.31mm/px · z∈[-390,-234]mm · 9 of 98 slices shown, 12 images]
[im 10/98  brain]
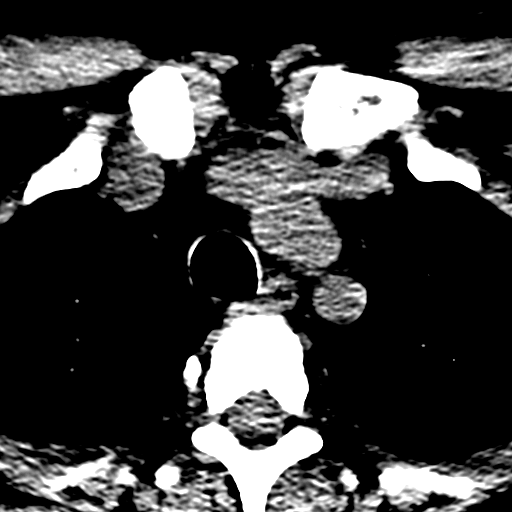
[im 10/98  bone]
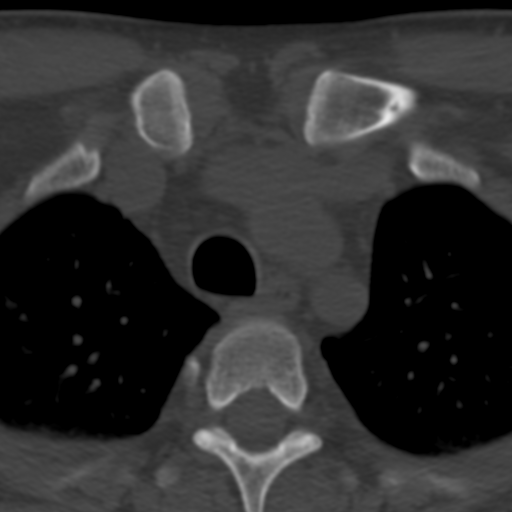
[im 20/98  bone]
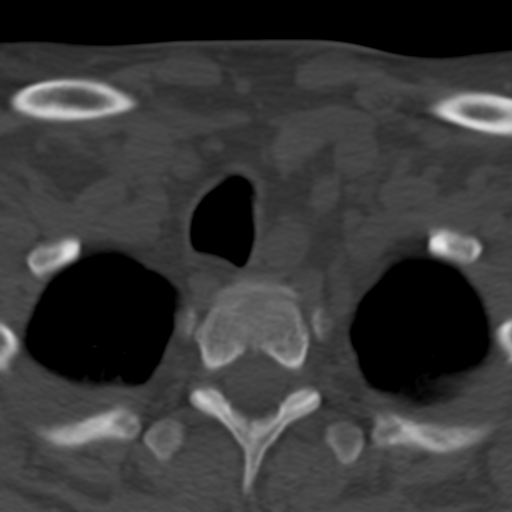
[im 30/98  bone]
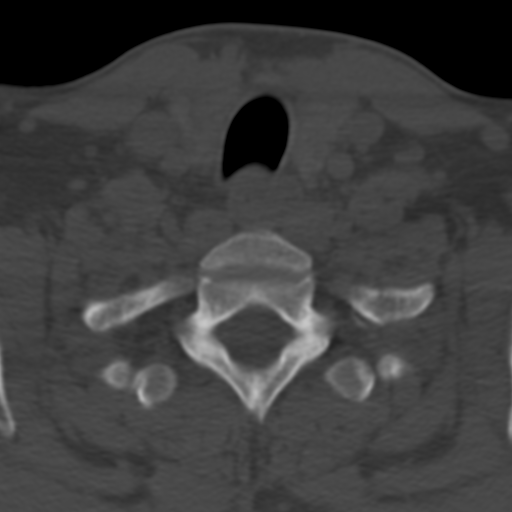
[im 39/98  bone]
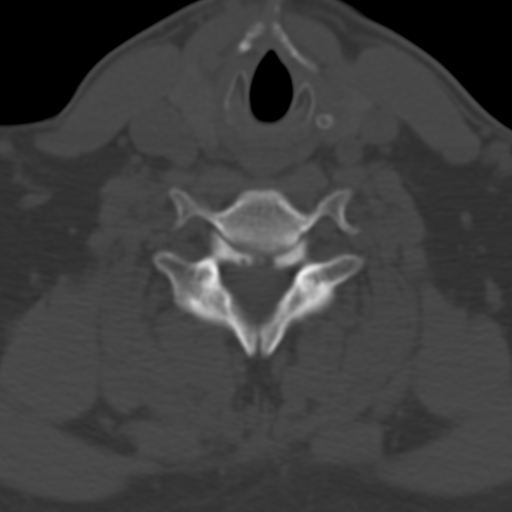
[im 49/98  brain]
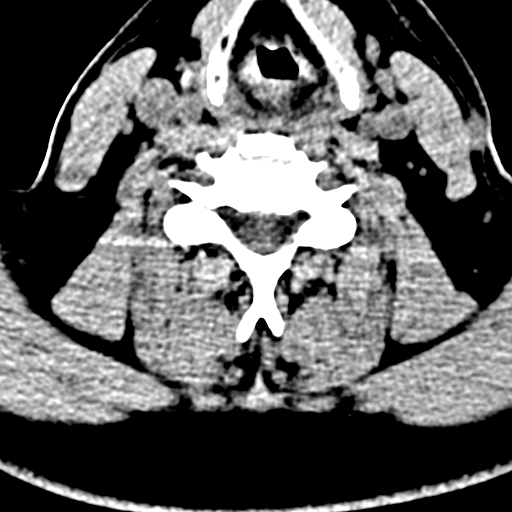
[im 49/98  bone]
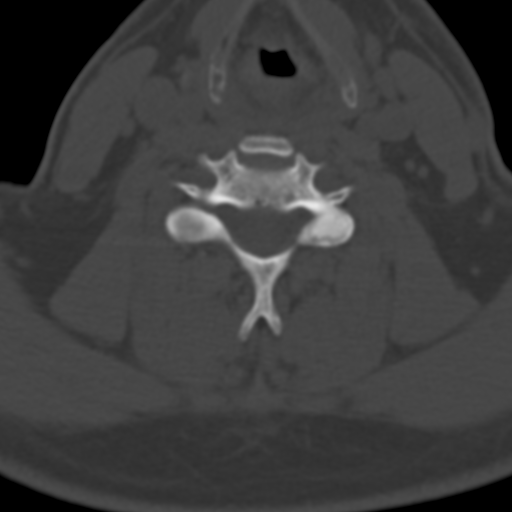
[im 59/98  bone]
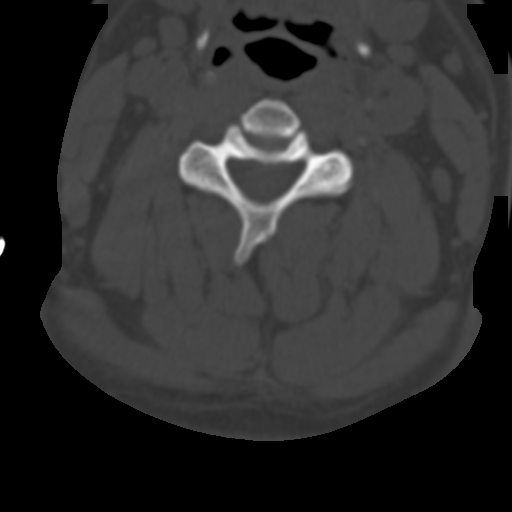
[im 68/98  bone]
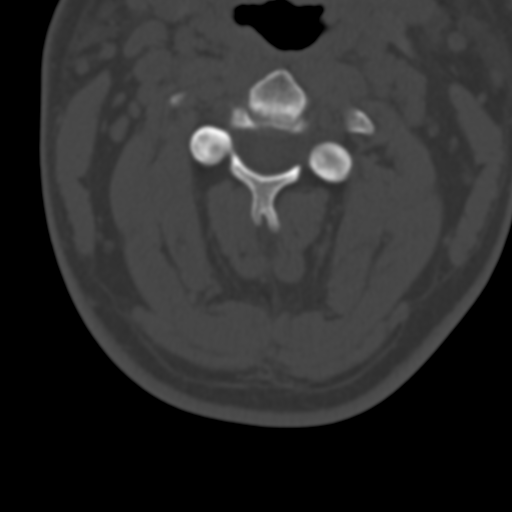
[im 78/98  bone]
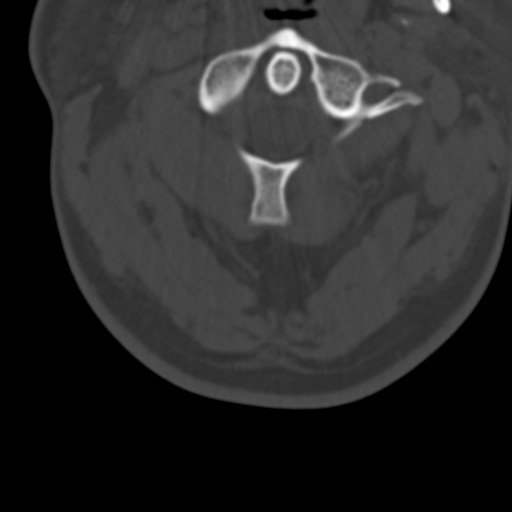
[im 88/98  brain]
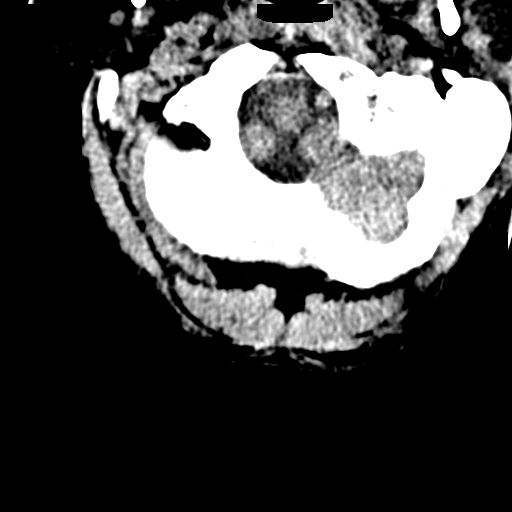
[im 88/98  bone]
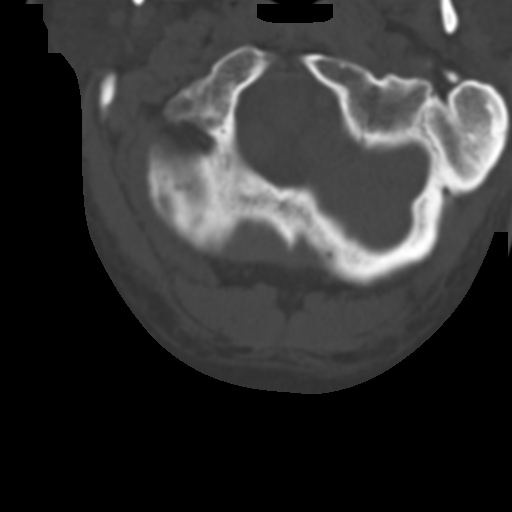

[15 of 47 positions shown; findings below may reference images not displayed]

FINDINGS: CT HEAD FINDINGS

Brain: No evidence of acute infarction, hemorrhage, hydrocephalus,
extra-axial collection or mass lesion/mass effect.

Vascular: No hyperdense vessel or unexpected calcification.

Skull: Normal. Negative for fracture or focal lesion.

Other: None.

CT MAXILLOFACIAL FINDINGS

Osseous: Acute fracture of the maxilla is noted in the midline at
the base of both upper central incisors. There is visible superior
displacement of tooth 8 with fracture surrounding the apical portion
of this tooth and also fracture planes surrounding the apical
portion of tooth 9. Fracture plane may extend overt towards the
apical portion of tooth 10. The T thumb cells appear grossly intact.
The temporomandibular joint show normal alignment.

Orbits: Normal and symmetric appearance of the orbits without
evidence of fracture or intraorbital hemorrhage. The globes and
extraocular muscles appear normal.

Sinuses: The paranasal sinuses and mastoid air cells are normally
aerated.

Soft tissues: No focal soft tissue abnormalities

CT CERVICAL SPINE FINDINGS

Alignment: Normal.

Skull base and vertebrae: No acute fracture. No primary bone lesion
or focal pathologic process.

Soft tissues and spinal canal: No prevertebral fluid or swelling. No
visible canal hematoma.

Disc levels: Normal disc space heights throughout the cervical
spine.

Upper chest: Negative.

Other: No incidental masses or enlarged lymph nodes. The thyroid
gland appears unremarkable.
IMPRESSION: 1. Normal head CT.
2. Acute fracture of the midline maxilla at the base of both upper
central incisors with superior displacement of tooth 8. Fracture
planes extend to the apical portion of tooth 9 and may extend
towards the apical portion of tooth 10.
3. Normal cervical spine CT.

## 2018-09-10 IMAGING — CR DG SHOULDER 2+V*R*
3 series · 3 of 3 positions shown · non-contrast
Comparison: 10/14/2003

CLINICAL DATA: Right shoulder pain after lifting injury tonight.
Previous dislocations.

EXAM:
RIGHT SHOULDER - 2+ VIEW

[shoulder grashey]
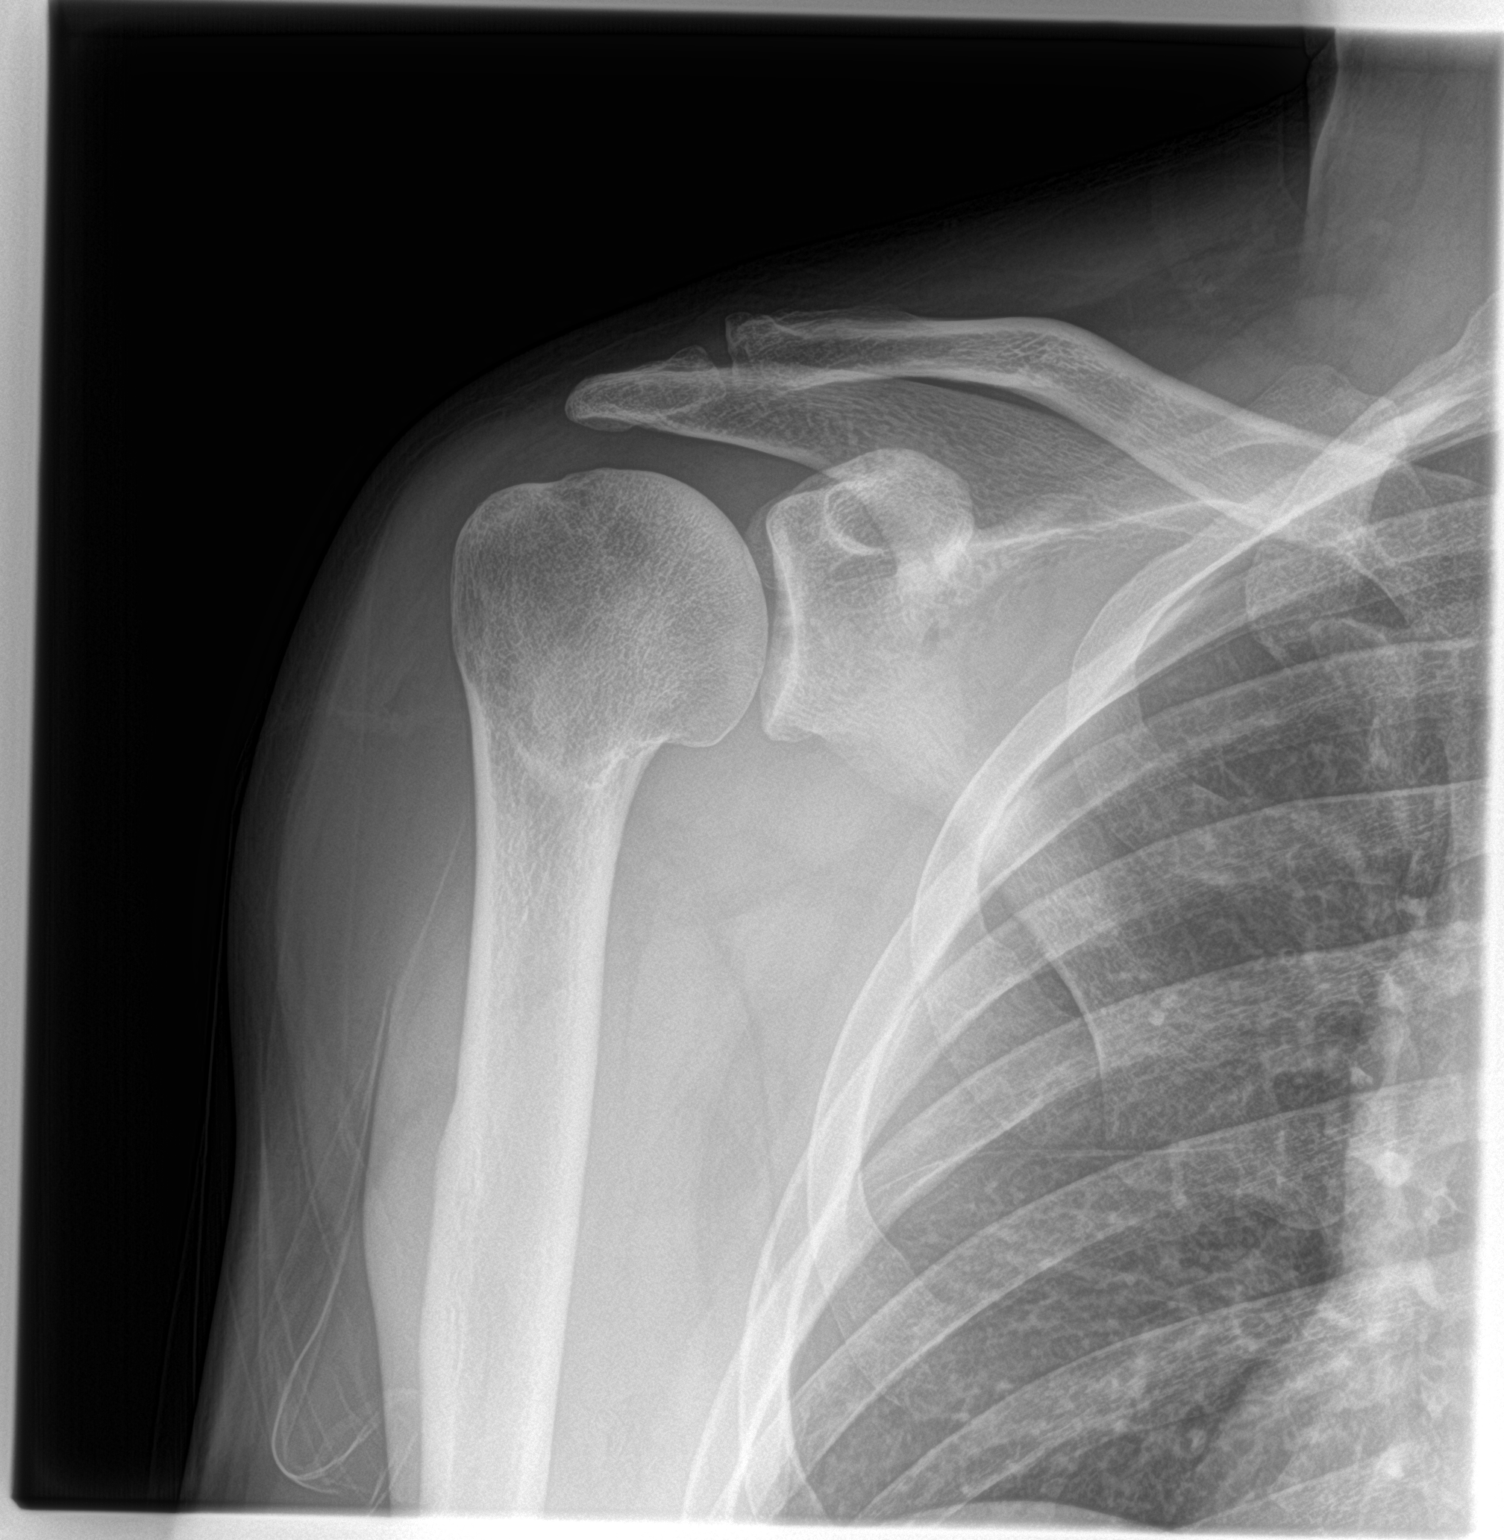

[shoulder y view]
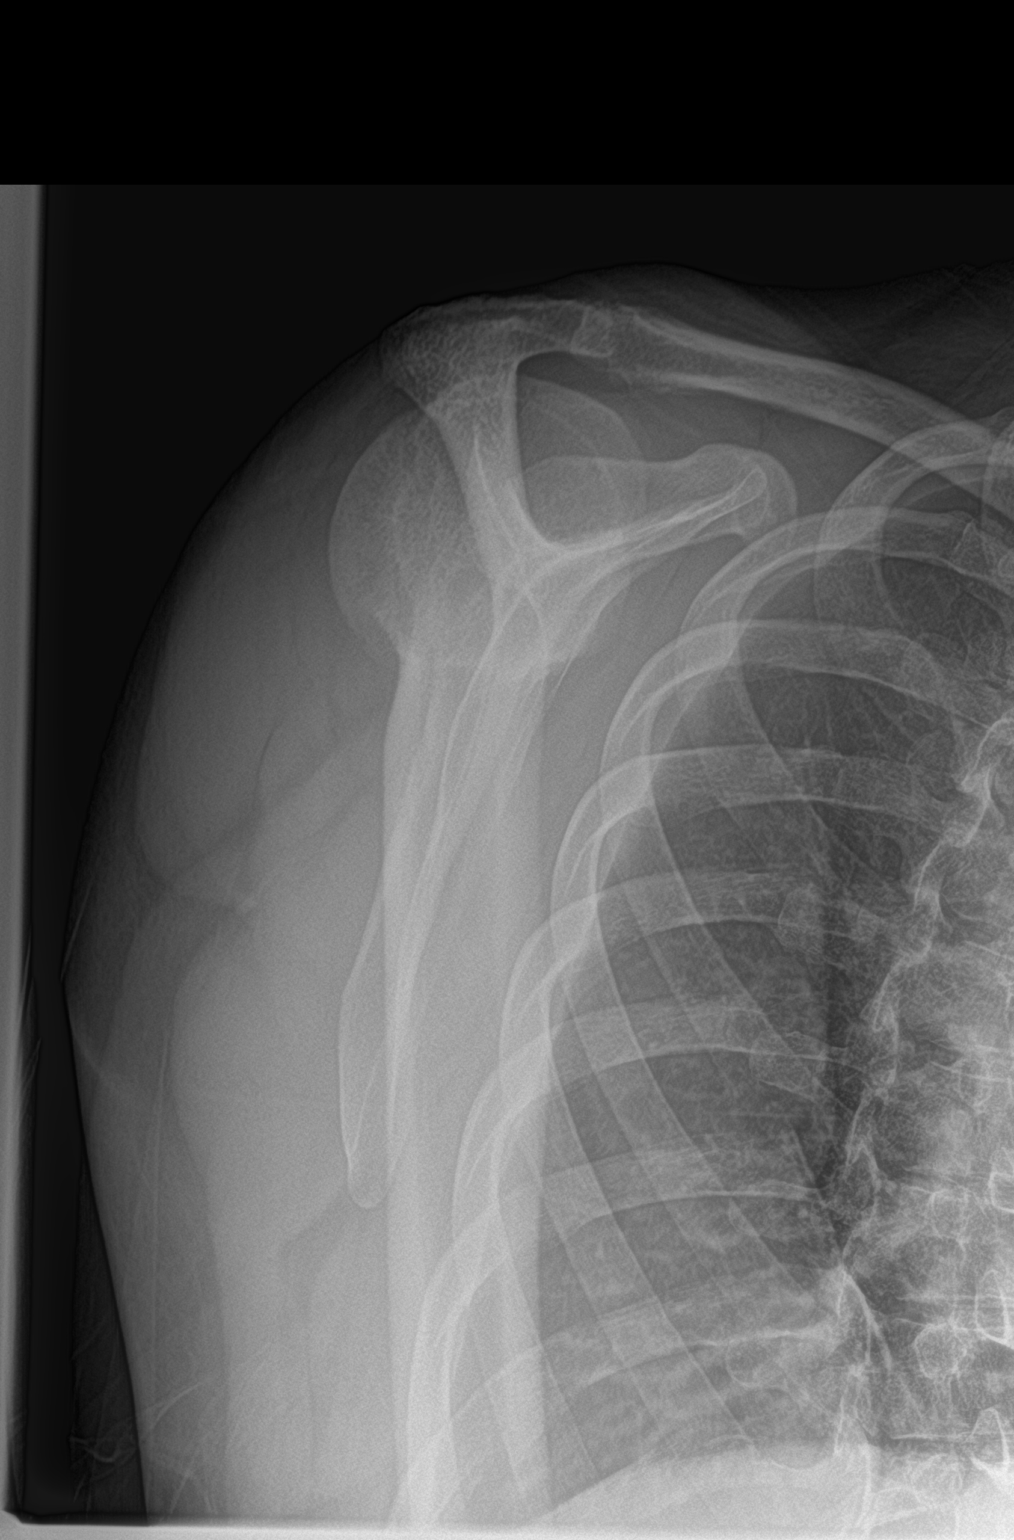

[shoulder axillary]
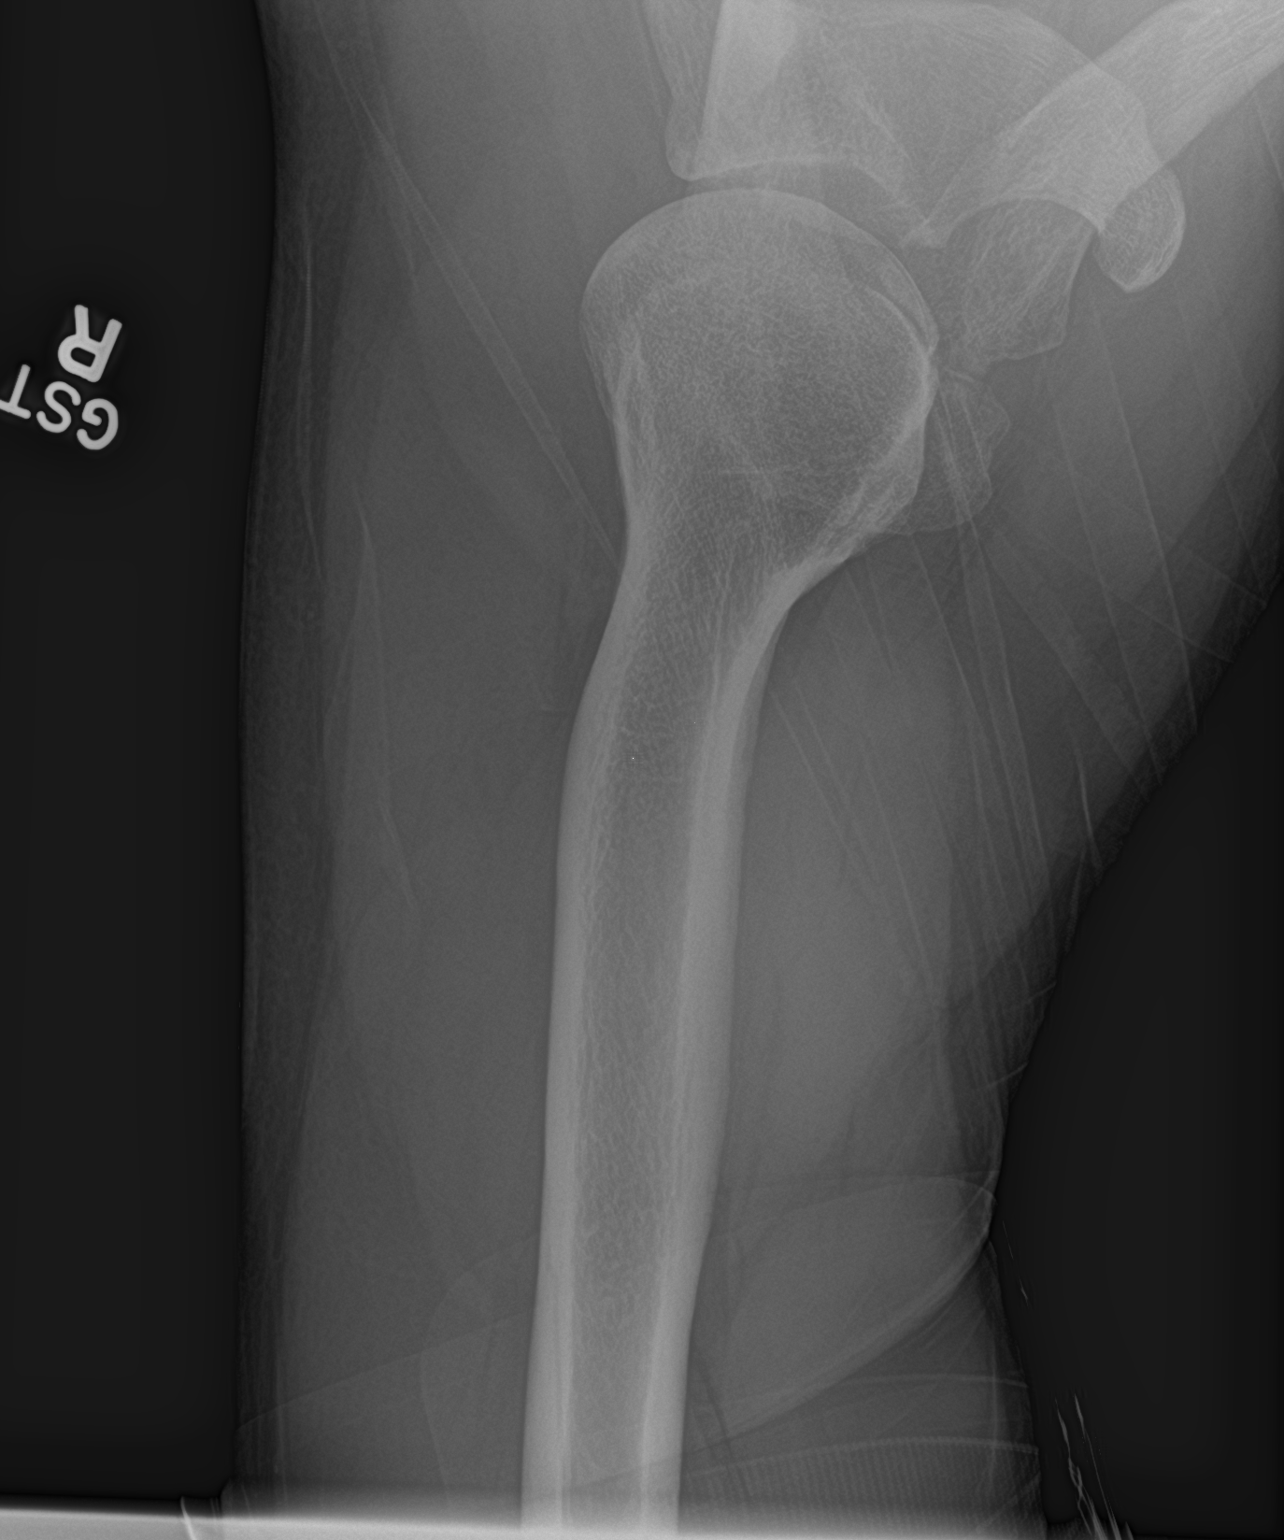

[3 of 3 positions shown; findings below may reference images not displayed]

FINDINGS: There is deformity of the surgical neck and proximal humeral shaft
which probably represents an old fracture deformity. There is no
definite evidence of any acute fracture or dislocation.
Coracoclavicular and acromioclavicular spaces are maintained. Soft
tissues are unremarkable. No focal bone lesion or bone destruction.
IMPRESSION: Probable old fracture deformity of the proximal humerus. No acute
fracture or dislocation is identified.

## 2019-07-13 DEATH — deceased
